# Patient Record
Sex: Female | Born: 1940 | Race: White | Hispanic: No | Marital: Married | Smoking: Never smoker
Health system: Southern US, Community
[De-identification: ages and names within clinical notes are randomized; demographics above are authoritative.]

## PROBLEM LIST (undated history)

## (undated) DIAGNOSIS — K759 Inflammatory liver disease, unspecified: Secondary | ICD-10-CM

## (undated) DIAGNOSIS — C801 Malignant (primary) neoplasm, unspecified: Secondary | ICD-10-CM

## (undated) DIAGNOSIS — G5792 Unspecified mononeuropathy of left lower limb: Secondary | ICD-10-CM

## (undated) DIAGNOSIS — K589 Irritable bowel syndrome without diarrhea: Secondary | ICD-10-CM

## (undated) DIAGNOSIS — M109 Gout, unspecified: Secondary | ICD-10-CM

## (undated) HISTORY — PX: COLONOSCOPY: SHX174

## (undated) HISTORY — PX: TONSILLECTOMY: SUR1361

## (undated) HISTORY — PX: SKIN CANCER EXCISION: SHX779

## (undated) HISTORY — PX: BREAST SURGERY: SHX581

## (undated) HISTORY — PX: ABDOMINAL HYSTERECTOMY: SHX81

---

## 2005-10-20 ENCOUNTER — Encounter: Payer: Self-pay | Admitting: Family Medicine

## 2005-11-12 ENCOUNTER — Encounter: Payer: Self-pay | Admitting: Family Medicine

## 2006-08-25 ENCOUNTER — Ambulatory Visit: Payer: Self-pay | Admitting: Dermatology

## 2007-07-27 ENCOUNTER — Ambulatory Visit: Payer: Self-pay | Admitting: Gastroenterology

## 2007-07-27 LAB — HM COLONOSCOPY

## 2012-10-25 ENCOUNTER — Ambulatory Visit: Payer: Self-pay | Admitting: Specialist

## 2012-11-24 ENCOUNTER — Ambulatory Visit: Payer: Self-pay | Admitting: Pain Medicine

## 2012-12-16 ENCOUNTER — Ambulatory Visit: Payer: Self-pay | Admitting: Pain Medicine

## 2013-01-10 ENCOUNTER — Ambulatory Visit: Payer: Self-pay | Admitting: Pain Medicine

## 2013-07-05 DIAGNOSIS — G250 Essential tremor: Secondary | ICD-10-CM | POA: Insufficient documentation

## 2013-07-05 DIAGNOSIS — K759 Inflammatory liver disease, unspecified: Secondary | ICD-10-CM | POA: Insufficient documentation

## 2013-07-05 DIAGNOSIS — G549 Nerve root and plexus disorder, unspecified: Secondary | ICD-10-CM | POA: Insufficient documentation

## 2013-11-22 ENCOUNTER — Ambulatory Visit: Payer: Medicare Other | Admitting: Podiatry

## 2013-12-01 ENCOUNTER — Ambulatory Visit (INDEPENDENT_AMBULATORY_CARE_PROVIDER_SITE_OTHER): Payer: Medicare Other | Admitting: Podiatry

## 2013-12-01 ENCOUNTER — Encounter: Payer: Self-pay | Admitting: Podiatry

## 2013-12-01 ENCOUNTER — Other Ambulatory Visit: Payer: Self-pay | Admitting: *Deleted

## 2013-12-01 VITALS — BP 113/67 | HR 65 | Resp 16 | Ht 65.0 in | Wt 120.0 lb

## 2013-12-01 DIAGNOSIS — L600 Ingrowing nail: Secondary | ICD-10-CM

## 2013-12-01 DIAGNOSIS — B351 Tinea unguium: Secondary | ICD-10-CM

## 2013-12-01 DIAGNOSIS — L608 Other nail disorders: Secondary | ICD-10-CM

## 2013-12-01 DIAGNOSIS — M21619 Bunion of unspecified foot: Secondary | ICD-10-CM

## 2013-12-01 DIAGNOSIS — M2010 Hallux valgus (acquired), unspecified foot: Secondary | ICD-10-CM

## 2013-12-01 NOTE — Patient Instructions (Addendum)

## 2013-12-01 NOTE — Progress Notes (Signed)
   Subjective:    Patient ID: CEAIRA ERNSTER, female    DOB: [birth year], 73 y.o.   MRN: [id number]  HPI Comments: 73 year old female presents the office today with complaints of thick toenails bilateral big toes. She states of the left hallux borders had been previously removed due to ingrowing toenails. Recently she has noticed a dark spot appear the left hallux nail for which she is concerned about. She recently had a pedicure her pedicurist told her to be seen by a podiatrist. She has no specific tenderness or pain related to the toes. She previously states that she was on Lamisil for a period of time for onychomycosis although it did not help.  She states that she has nerve damage the left foot for which she has been evaluated by neurology other specialties, the etiology is unknown.      Review of Systems  All other systems reviewed and are negative.      Objective:   Physical Exam  Nursing note and vitals reviewed. Constitutional: She is oriented to person, place, and time. She appears well-developed.  Musculoskeletal: Normal range of motion. She exhibits no edema and no tenderness.  Bilateral HAV  Neurological: She is alert and oriented to person, place, and time.  Decreased sensation to the left foot. Protective sensation intact to the right foot.   Skin:  Bilateral hallux nails hypertrophic, elongated, brittle, yellow-brown discoloration. Lesser digit nails with yellow discoloration. Left hallux nail has an area of black discoloration to the proximal after the nail plate. Also the left hallux nail there is lifting of the nail plate distally and along the lateral border of the nail. There is no extension of hyperpigmentation into the skin surrounding the nail. The skin around the nail is intact. There is no drainage, erythema, purulence or other clinical signs of infection around the nails.  DP/PT pulses palpable b/l. CRT <3 sec.         Assessment & Plan:  73 year old female  with onychomycosis/nail dystrophy.  -Both conservative and surgical intervention was discussed with the patient in detail including alternatives, risks, complications. -Due to the left hallux nail lifting of the plate from the nailbed as well as the hyperpigmented spot in the nail plate removal of the nail was discussed with the patient and the nail to be sent for biopsy. Patient wishes to have the nail removed at this time. Under sterile skin preparation a total of 1.5cc of 0.5% Marcaine plain was infiltrated in a hallux block fashion on the left hallux. Skin was then prepped in normal sterile fashion. The left hallux nail was then removed in total. The hyperpigmented spot appear to be localized strictly to the nail and the underlying skin was intact with no pigmentation/ulceration. Dressing was applied. The care was used and after the application the dressing was removed and there is noted the immediate CRT time to the digit. Patient tolerated the procedure well any complications. -Post procedure nail instructions discussed the patient in detail. -Right hallux nail and lesser digit nails sent for biopsy for evaluation of onychomycosis. -Followup in 1 week or sooner if any problems are to arise. Monitor for signs or symptoms of infection and directed call immediately if any are to occur or go directly to the emergency room.

## 2013-12-02 ENCOUNTER — Telehealth: Payer: Self-pay | Admitting: Podiatry

## 2013-12-02 NOTE — Telephone Encounter (Signed)
Call patient to follow with her after her procedure yesterday however she did not answer but I did leave a message. Directed call any questions or concerns.

## 2013-12-08 ENCOUNTER — Ambulatory Visit (INDEPENDENT_AMBULATORY_CARE_PROVIDER_SITE_OTHER): Payer: Medicare Other | Admitting: Podiatry

## 2013-12-08 DIAGNOSIS — L608 Other nail disorders: Secondary | ICD-10-CM

## 2013-12-08 DIAGNOSIS — B351 Tinea unguium: Secondary | ICD-10-CM

## 2013-12-08 NOTE — Progress Notes (Signed)
Patient ID: ELARA COCKE, female   DOB: 03-15-1941, 73 y.o.   MRN: [id number]  She is continuing with epsom salt soaks and covering with antibiotic ointment and band-aid. She denies any systemic complaints such as fevers, chills, nausea, vomiting. Denies any pain, drainage, or redness around the area. No acute changes since last appointment.  Objective: DP/PT pulse palpable b/l. CRT < 3 sec. Protective sensation intact.  Left hallux nail removal site healing well without complications.Still a small granular area in the central aspect of the nail bed. No surrounding erythema, drainage, pain. No clinical sings of infection.  Assessment: 73 year old female 1 week s/p L hallux nail avulsion  Plan: Patient is doing well any clinical signs of infection. She can continue with Epson salt soaks until the wound has completely healed. Continue with antibiotic ointment and a Band-Aid daily during the day however should keep uncovered at night. Continue to monitor for any signs or symptoms of infection and directed to call the office immediately if any are to occur. We'll call her with the results of the nail biopsies once they were obtained. No other complaints.

## 2013-12-08 NOTE — Patient Instructions (Signed)
Can switch to epsom salt soaks. Antibiotic ointment and band-aid during the day. Can keep uncovered at night.

## 2013-12-13 ENCOUNTER — Telehealth: Payer: Self-pay | Admitting: *Deleted

## 2013-12-13 NOTE — Telephone Encounter (Signed)
Patient called wanted her results from the fungal culture, explained to patient that it was not back yet it could take up to a couple of weeks to get it back , patient will be notified when results are in

## 2013-12-22 ENCOUNTER — Ambulatory Visit (INDEPENDENT_AMBULATORY_CARE_PROVIDER_SITE_OTHER): Payer: Medicare Other | Admitting: Podiatry

## 2013-12-22 VITALS — BP 111/63 | HR 65 | Resp 16

## 2013-12-22 DIAGNOSIS — B351 Tinea unguium: Secondary | ICD-10-CM

## 2013-12-22 MED ORDER — EFINACONAZOLE 10 % EX SOLN: 1.0000 [drp] | Freq: Every day | CUTANEOUS | Status: DC

## 2013-12-22 NOTE — Patient Instructions (Signed)

## 2013-12-23 NOTE — Progress Notes (Signed)
Patient ID: NATOSHA BOU, female   DOB: 05/21/1940, 73 y.o.   MRN: [id number]  Subjective: Ms. Muchow returns the office they for followup evaluation status post left hallux nail avulsion due to followup with biopsy results. States that she is doing well has continued with Epsom salt soaks and Intermedic ointment and a Band-Aid. Currently she denies any drainage from the area and has no complaints. Denies any systemic complaints such as fevers, chills, nausea, vomiting.  Objective: AAO x3, NAD DP/PT pulses palpable 2/4 b/l. CRT < 3sec Protective sensation intact. Left hallux nail status post removal healing well. No further granulation tissue noted in the areas healed. There is no sign erythema, drainage. No tenderness to palpation. Right hallux nail starting to lift from the underlying nailbed distally. No surrounding erythema or drainage or pain. No leg pain, swelling, warmth.  Assessment: 73 year old female with healing left hallux status post nail avulsion, onychomycosis  Plan: -At this appointment we discussed alternatives, risks, complications of various treatments for onychomycosis. Biopsy results were reviewed with the patient.  -At this time patient does not want any systemic therapy due to risk of side effects therefore she would like to proceed with Jublia knowing that it may not be the best treatment given her culture results. Patient was directed on side effects of the medication and directed to stop immediately if any are to occur in call the office. She was directed on how to use the medication. -At this time the left hallux nail site is healed. Would continue to cover as needed.  -Followup as needed. Call with any questions or concerns or change in symptoms.

## 2013-12-27 ENCOUNTER — Telehealth: Payer: Self-pay | Admitting: *Deleted

## 2013-12-27 NOTE — Telephone Encounter (Signed)
Called and left message letting pt know she can purchase fungi-nail otc or we can prescribe penlac for her. Asked pt to call back with any questions.

## 2013-12-27 NOTE — Telephone Encounter (Signed)
Message copied by Laury Axon on Tue Dec 27, 2013  1:31 PM ------      Message from: Celesta Gentile R      Created: Tue Dec 27, 2013  1:06 PM       She can use OTC fungi-nail or we could prescribe penlac. She wants to use topica. So those are our only other choices.             Also, when you get time can you call [facility] and see if they were able to do the further evaluation of the hyperpigmented spot? Thanks.  ------

## 2013-12-27 NOTE — Telephone Encounter (Signed)
Pt called and stated she did not get efinaconazole. She is wanting something else  instead of the mail in rx. Is this ok?

## 2013-12-29 ENCOUNTER — Encounter: Payer: Self-pay | Admitting: Podiatry

## 2013-12-29 ENCOUNTER — Telehealth: Payer: Self-pay | Admitting: Podiatry

## 2013-12-29 MED ORDER — TAVABOROLE 5 % EX SOLN: 1.0000 [drp] | CUTANEOUS | Status: DC

## 2013-12-29 NOTE — Telephone Encounter (Signed)
Called patient to follow up with her after the results of the nail biopsy were obtained. I called her to give her results.   Also, she states Jublia was too expensive. I prescribed kerydin and gave her the phone number to call [facility].

## 2014-01-03 ENCOUNTER — Encounter: Payer: Self-pay | Admitting: Podiatry

## 2014-11-06 ENCOUNTER — Other Ambulatory Visit: Payer: Self-pay

## 2014-11-15 ENCOUNTER — Ambulatory Visit: Admit: 2014-11-15 | Payer: Self-pay | Admitting: Specialist

## 2014-11-15 SURGERY — OSTEOTOMY, METATARSAL BONE
Anesthesia: General | Laterality: Bilateral

## 2015-07-31 DIAGNOSIS — D485 Neoplasm of uncertain behavior of skin: Secondary | ICD-10-CM | POA: Diagnosis not present

## 2015-09-05 DIAGNOSIS — H2512 Age-related nuclear cataract, left eye: Secondary | ICD-10-CM | POA: Diagnosis not present

## 2015-10-10 DIAGNOSIS — M2011 Hallux valgus (acquired), right foot: Secondary | ICD-10-CM | POA: Diagnosis not present

## 2015-10-10 DIAGNOSIS — M898X9 Other specified disorders of bone, unspecified site: Secondary | ICD-10-CM | POA: Diagnosis not present

## 2015-10-10 DIAGNOSIS — M10071 Idiopathic gout, right ankle and foot: Secondary | ICD-10-CM | POA: Diagnosis not present

## 2015-10-10 DIAGNOSIS — M79671 Pain in right foot: Secondary | ICD-10-CM | POA: Diagnosis not present

## 2015-10-31 DIAGNOSIS — H2512 Age-related nuclear cataract, left eye: Secondary | ICD-10-CM | POA: Diagnosis not present

## 2015-11-05 ENCOUNTER — Encounter: Payer: Self-pay | Admitting: *Deleted

## 2015-11-13 ENCOUNTER — Ambulatory Visit
Admission: RE | Admit: 2015-11-13 | Discharge: 2015-11-13 | Disposition: A | Discharge status: Home / Self Care | Payer: PPO | Source: Ambulatory Visit | Attending: Ophthalmology | Admitting: Ophthalmology

## 2015-11-13 ENCOUNTER — Encounter: Payer: Self-pay | Admitting: *Deleted

## 2015-11-13 ENCOUNTER — Ambulatory Visit: Payer: PPO | Admitting: Anesthesiology

## 2015-11-13 ENCOUNTER — Encounter
Admission: RE | Disposition: A | Discharge status: Home / Self Care | Payer: Self-pay | Source: Ambulatory Visit | Attending: Ophthalmology

## 2015-11-13 DIAGNOSIS — Z8619 Personal history of other infectious and parasitic diseases: Secondary | ICD-10-CM | POA: Insufficient documentation

## 2015-11-13 DIAGNOSIS — H2512 Age-related nuclear cataract, left eye: Secondary | ICD-10-CM | POA: Insufficient documentation

## 2015-11-13 DIAGNOSIS — Z8719 Personal history of other diseases of the digestive system: Secondary | ICD-10-CM | POA: Diagnosis not present

## 2015-11-13 DIAGNOSIS — Z85828 Personal history of other malignant neoplasm of skin: Secondary | ICD-10-CM | POA: Insufficient documentation

## 2015-11-13 DIAGNOSIS — M109 Gout, unspecified: Secondary | ICD-10-CM | POA: Insufficient documentation

## 2015-11-13 HISTORY — DX: Inflammatory liver disease, unspecified: K75.9

## 2015-11-13 HISTORY — DX: Unspecified mononeuropathy of left lower limb: G57.92

## 2015-11-13 HISTORY — DX: Malignant (primary) neoplasm, unspecified: C80.1

## 2015-11-13 HISTORY — DX: Gout, unspecified: M10.9

## 2015-11-13 HISTORY — PX: CATARACT EXTRACTION W/PHACO: SHX586

## 2015-11-13 HISTORY — DX: Irritable bowel syndrome, unspecified: K58.9

## 2015-11-13 SURGERY — PHACOEMULSIFICATION, CATARACT, WITH IOL INSERTION
Anesthesia: General | Site: Eye | Laterality: Left | Wound class: Clean

## 2015-11-13 MED ORDER — LIDOCAINE HCL (PF) 4 % IJ SOLN
INTRAMUSCULAR | Status: AC
  Filled 2015-11-13: qty 5

## 2015-11-13 MED ORDER — CEFUROXIME OPHTHALMIC INJECTION 1 MG/0.1 ML
INJECTION | OPHTHALMIC | Status: AC
  Filled 2015-11-13: qty 0.1

## 2015-11-13 MED ORDER — POVIDONE-IODINE 5 % OP SOLN
1.0000 "application " | Freq: Once | OPHTHALMIC | Status: AC
  Administered 2015-11-13: 1 via OPHTHALMIC

## 2015-11-13 MED ORDER — NA CHONDROIT SULF-NA HYALURON 40-17 MG/ML IO SOLN
INTRAOCULAR | Status: AC
  Filled 2015-11-13: qty 1

## 2015-11-13 MED ORDER — ARMC OPHTHALMIC DILATING GEL
OPHTHALMIC | Status: AC
  Administered 2015-11-13: 1 via OPHTHALMIC
  Filled 2015-11-13: qty 0.25

## 2015-11-13 MED ORDER — ALFENTANIL 500 MCG/ML IJ INJ
INJECTION | INTRAMUSCULAR | Status: DC | PRN
  Administered 2015-11-13: 500 ug via INTRAVENOUS

## 2015-11-13 MED ORDER — ONDANSETRON HCL 4 MG/2ML IJ SOLN: 4.0000 mg | Freq: Once | INTRAMUSCULAR | Status: DC | PRN

## 2015-11-13 MED ORDER — NA CHONDROIT SULF-NA HYALURON 40-17 MG/ML IO SOLN
INTRAOCULAR | Status: DC | PRN
  Administered 2015-11-13: 1 mL via INTRAOCULAR

## 2015-11-13 MED ORDER — EPINEPHRINE HCL 1 MG/ML IJ SOLN
INTRAMUSCULAR | Status: DC | PRN
  Administered 2015-11-13: 1 mL via OPHTHALMIC

## 2015-11-13 MED ORDER — TETRACAINE HCL 0.5 % OP SOLN
1.0000 [drp] | Freq: Once | OPHTHALMIC | Status: AC
  Administered 2015-11-13: 1 [drp] via OPHTHALMIC

## 2015-11-13 MED ORDER — POVIDONE-IODINE 5 % OP SOLN
OPHTHALMIC | Status: AC
  Administered 2015-11-13: 1 via OPHTHALMIC
  Filled 2015-11-13: qty 30

## 2015-11-13 MED ORDER — CARBACHOL 0.01 % IO SOLN
INTRAOCULAR | Status: DC | PRN
  Administered 2015-11-13: .5 mL via INTRAOCULAR

## 2015-11-13 MED ORDER — ACETAMINOPHEN 500 MG PO TABS
1000.0000 mg | ORAL_TABLET | Freq: Once | ORAL | Status: AC
  Administered 2015-11-13: 1000 mg via ORAL

## 2015-11-13 MED ORDER — LIDOCAINE HCL (PF) 1 % IJ SOLN
INTRAMUSCULAR | Status: AC
  Filled 2015-11-13: qty 2

## 2015-11-13 MED ORDER — CEFUROXIME OPHTHALMIC INJECTION 1 MG/0.1 ML
INJECTION | OPHTHALMIC | Status: DC | PRN
  Administered 2015-11-13: .1 mL via INTRACAMERAL

## 2015-11-13 MED ORDER — LIDOCAINE HCL (PF) 4 % IJ SOLN
INTRAMUSCULAR | Status: DC | PRN
  Administered 2015-11-13: .5 mL via OPHTHALMIC

## 2015-11-13 MED ORDER — MIDAZOLAM HCL 2 MG/2ML IJ SOLN
INTRAMUSCULAR | Status: DC | PRN
  Administered 2015-11-13 (×2): 0.5 mg via INTRAVENOUS

## 2015-11-13 MED ORDER — MOXIFLOXACIN HCL 0.5 % OP SOLN
OPHTHALMIC | Status: AC
  Filled 2015-11-13: qty 3

## 2015-11-13 MED ORDER — SODIUM CHLORIDE 0.9 % IV SOLN
INTRAVENOUS | Status: DC
  Administered 2015-11-13: 12:00:00 via INTRAVENOUS

## 2015-11-13 MED ORDER — MOXIFLOXACIN HCL 0.5 % OP SOLN
OPHTHALMIC | Status: DC | PRN
  Administered 2015-11-13: 1 [drp] via OPHTHALMIC

## 2015-11-13 MED ORDER — TETRACAINE HCL 0.5 % OP SOLN
OPHTHALMIC | Status: AC
  Administered 2015-11-13: 1 [drp] via OPHTHALMIC
  Filled 2015-11-13: qty 2

## 2015-11-13 MED ORDER — ARMC OPHTHALMIC DILATING GEL
1.0000 "application " | OPHTHALMIC | Status: AC
  Administered 2015-11-13 (×2): 1 via OPHTHALMIC

## 2015-11-13 MED ORDER — FENTANYL CITRATE (PF) 100 MCG/2ML IJ SOLN: 25.0000 ug | INTRAMUSCULAR | Status: DC | PRN

## 2015-11-13 MED ORDER — EPINEPHRINE HCL 1 MG/ML IJ SOLN
INTRAMUSCULAR | Status: AC
  Filled 2015-11-13: qty 2

## 2015-11-13 MED ORDER — ACETAMINOPHEN 500 MG PO TABS
ORAL_TABLET | ORAL | Status: AC
  Filled 2015-11-13: qty 2

## 2015-11-13 SURGICAL SUPPLY — 21 items
CANNULA ANT/CHMB 27GA (MISCELLANEOUS) ×3 IMPLANT
CUP MEDICINE 2OZ PLAST GRAD ST (MISCELLANEOUS) ×3 IMPLANT
GLOVE BIO SURGEON STRL SZ8 (GLOVE) ×3 IMPLANT
GLOVE BIOGEL M 6.5 STRL (GLOVE) ×3 IMPLANT
GLOVE SURG LX 8.0 MICRO (GLOVE) ×2
GLOVE SURG LX STRL 8.0 MICRO (GLOVE) ×1 IMPLANT
GOWN STRL REUS W/ TWL LRG LVL3 (GOWN DISPOSABLE) ×2 IMPLANT
GOWN STRL REUS W/TWL LRG LVL3 (GOWN DISPOSABLE) ×4
LENS IOL TECNIS SYMFONY 14.5 (Intraocular Lens) ×3 IMPLANT
PACK CATARACT (MISCELLANEOUS) ×3 IMPLANT
PACK CATARACT BRASINGTON LX (MISCELLANEOUS) ×3 IMPLANT
PACK EYE AFTER SURG (MISCELLANEOUS) ×3 IMPLANT
SOL BSS BAG (MISCELLANEOUS) ×3
SOL PREP PVP 2OZ (MISCELLANEOUS) ×3
SOLUTION BSS BAG (MISCELLANEOUS) ×1 IMPLANT
SOLUTION PREP PVP 2OZ (MISCELLANEOUS) ×1 IMPLANT
SYR 3ML LL SCALE MARK (SYRINGE) ×3 IMPLANT
SYR 5ML LL (SYRINGE) ×3 IMPLANT
SYR TB 1ML 27GX1/2 LL (SYRINGE) ×3 IMPLANT
WATER STERILE IRR 1000ML POUR (IV SOLUTION) ×3 IMPLANT
WIPE NON LINTING 3.25X3.25 (MISCELLANEOUS) ×3 IMPLANT

## 2015-11-13 NOTE — H&P (Signed)
  All labs reviewed. Abnormal studies sent to patients PCP when indicated.  Previous H&P reviewed, patient examined, there are NO CHANGES.  Cassandra Pilar LOUIS8/1/201711:53 AM

## 2015-11-13 NOTE — Discharge Instructions (Signed)
Eye Surgery Discharge Instructions  Expect mild scratchy sensation or mild soreness. DO NOT RUB YOUR EYE!  The day of surgery:  Minimal physical activity, but bed rest is not required  No reading, computer work, or close hand work  No bending, lifting, or straining.  May watch TV  For 24 hours:  No driving, legal decisions, or alcoholic beverages  Safety precautions  Eat anything you prefer: It is better to start with liquids, then soup then solid foods.  _____ Eye patch should be worn until postoperative exam tomorrow.  ____ Solar shield eyeglasses should be worn for comfort in the sunlight/patch while sleeping  Resume all regular medications including aspirin or Coumadin if these were discontinued prior to surgery. You may shower, bathe, shave, or wash your hair. Tylenol may be taken for mild discomfort.  Call your doctor if you experience significant pain, nausea, or vomiting, fever > 101 or other signs of infection. [phone] or [phone] Specific instructions:  Follow-up Information    PORFILIO,WILLIAM LOUIS, MD Follow up on 11/14/2015.   Specialty:  Ophthalmology Why:  9:10 Contact information: [address] [place] [address] [phone]

## 2015-11-13 NOTE — Transfer of Care (Signed)
Immediate Anesthesia Transfer of Care Note  Patient: Cassandra [name]  Procedure(s) Performed: Procedure(s) with comments: CATARACT EXTRACTION PHACO AND INTRAOCULAR LENS PLACEMENT (IOC) (Left) - [place] 00:31 AP% 11.9 CDE 3.72 Fluid pack  lot # [id number]  Patient Location: PACU and Short Stay  Anesthesia Type:MAC  Level of Consciousness: awake, oriented and patient cooperative  Airway & Oxygen Therapy: Patient Spontanous Breathing and Patient connected to nasal cannula oxygen  Post-op Assessment: Report given to RN and Post -op Vital signs reviewed and stable  Post vital signs: Reviewed and stable  Last Vitals:  Vitals:   11/13/15 1107  BP: 139/75  Pulse: 86  Resp: 20  Temp: 36.7 C    Last Pain:  Vitals:   11/13/15 1107  TempSrc: Oral      Patients Stated Pain Goal: 0 ([id number])  Complications: No apparent anesthesia complications

## 2015-11-13 NOTE — Anesthesia Preprocedure Evaluation (Signed)
Anesthesia Evaluation  Patient identified by MRN, date of birth, ID band Patient awake    Reviewed: Allergy & Precautions, H&P , NPO status , Patient's Chart, lab work & pertinent test results, reviewed documented beta blocker date and time   Airway Mallampati: II  TM Distance: >3 FB Neck ROM: full    Dental  (+) Teeth Intact   Pulmonary neg pulmonary ROS,    Pulmonary exam normal        Cardiovascular negative cardio ROS Normal cardiovascular exam Rhythm:regular Rate:Normal     Neuro/Psych  Neuromuscular disease negative neurological ROS  negative psych ROS   GI/Hepatic negative GI ROS, Neg liver ROS, (+) Hepatitis -  Endo/Other  negative endocrine ROS  Renal/GU negative Renal ROS  negative genitourinary   Musculoskeletal   Abdominal   Peds  Hematology negative hematology ROS (+)   Anesthesia Other Findings Past Medical History: No date: Cancer ([place])     Comment: skin cancer No date: Gout No date: Hepatitis     Comment: 40 years ago No date: Irritable bowel syndrome     Comment: history of.... No date: Nerve damage of left foot Past Surgical History: No date: ABDOMINAL HYSTERECTOMY No date: COLONOSCOPY No date: SKIN CANCER EXCISION No date: TONSILLECTOMY BMI    Body Mass Index:  19.64 kg/m     Reproductive/Obstetrics negative OB ROS                             Anesthesia Physical Anesthesia Plan  ASA: II  Anesthesia Plan: General ETT   Post-op Pain Management:    Induction:   Airway Management Planned:   Additional Equipment:   Intra-op Plan:   Post-operative Plan:   Informed Consent: I have reviewed the patients History and Physical, chart, labs and discussed the procedure including the risks, benefits and alternatives for the proposed anesthesia with the patient or authorized representative who has indicated his/her understanding and acceptance.   Dental  Advisory Given  Plan Discussed with: CRNA  Anesthesia Plan Comments:         Anesthesia Quick Evaluation

## 2015-11-13 NOTE — Op Note (Signed)
PREOPERATIVE DIAGNOSIS:  Nuclear sclerotic cataract of the left eye.   POSTOPERATIVE DIAGNOSIS:  nuclear sclerotic cataract left eye   OPERATIVE PROCEDURE:  Procedure(s): CATARACT EXTRACTION PHACO AND INTRAOCULAR LENS PLACEMENT (IOC)   SURGEON:  Birder Robson, MD.   ANESTHESIA:   Anesthesiologist: Molli Barrows, MD CRNA: Courtney Paris, CRNA  1.      Managed anesthesia care. 2.      Topical tetracaine drops followed by 2% Xylocaine jelly applied in the preoperative holding area.   COMPLICATIONS:  None.   TECHNIQUE:   Stop and chop   DESCRIPTION OF PROCEDURE:  The patient was examined and consented in the preoperative holding area where the aforementioned topical anesthesia was applied to the left eye and then brought back to the Operating Room where the left eye was prepped and draped in the usual sterile ophthalmic fashion and a lid speculum was placed. A paracentesis was created with the side port blade and the anterior chamber was filled with viscoelastic. A near clear corneal incision was performed with the steel keratome. A continuous curvilinear capsulorrhexis was performed with a cystotome followed by the capsulorrhexis forceps. Hydrodissection and hydrodelineation were carried out with BSS on a blunt cannula. The lens was removed in a stop and chop  technique and the remaining cortical material was removed with the irrigation-aspiration handpiece. The capsular bag was inflated with viscoelastic and the Technis ZCB00 lens was placed in the capsular bag without complication. The remaining viscoelastic was removed from the eye with the irrigation-aspiration handpiece. The wounds were hydrated. The anterior chamber was flushed with Miostat and the eye was inflated to physiologic pressure. 0.1 mL of cefuroxime concentration 10 mg/mL was placed in the anterior chamber. The wounds were found to be water tight. The eye was dressed with Vigamox. The patient was given protective glasses to wear  throughout the day and a shield with which to sleep tonight. The patient was also given drops with which to begin a drop regimen today and will follow-up with me in one day.  Implant Name Type Inv. Item Serial No. Manufacturer Lot No. LRB No. Used  LENS IOL SYMFONY 14.5 - [id number] Intraocular Lens LENS IOL SYMFONY 14.5 [id number]   Left 1   Procedure(s) with comments: CATARACT EXTRACTION PHACO AND INTRAOCULAR LENS PLACEMENT (IOC) (Left) - [place] 00:31 AP% 11.9 CDE 3.72 Fluid pack  lot # [id number]  Electronically signed: [place] 11/13/2015 12:28 PM

## 2015-11-14 NOTE — Anesthesia Postprocedure Evaluation (Signed)
Anesthesia Post Note  Patient: Cassandra [name]  Procedure(s) Performed: Procedure(s) (LRB): CATARACT EXTRACTION PHACO AND INTRAOCULAR LENS PLACEMENT (IOC) (Left)  Patient location during evaluation: PACU Anesthesia Type: General Level of consciousness: awake and alert Pain management: pain level controlled Vital Signs Assessment: post-procedure vital signs reviewed and stable Respiratory status: spontaneous breathing, nonlabored ventilation, respiratory function stable and patient connected to nasal cannula oxygen Cardiovascular status: blood pressure returned to baseline and stable Postop Assessment: no signs of nausea or vomiting Anesthetic complications: no    Last Vitals:  Vitals:   11/13/15 1230 11/13/15 1252  BP: 111/77 134/60  Pulse: (!) 46 (!) 56  Resp: 16 16  Temp: 36.8 C     Last Pain:  Vitals:   11/13/15 1252  TempSrc:   PainSc: [place]

## 2015-12-04 DIAGNOSIS — H2511 Age-related nuclear cataract, right eye: Secondary | ICD-10-CM | POA: Diagnosis not present

## 2015-12-05 ENCOUNTER — Encounter: Payer: Self-pay | Admitting: *Deleted

## 2015-12-11 ENCOUNTER — Ambulatory Visit: Payer: PPO | Admitting: Certified Registered"

## 2015-12-11 ENCOUNTER — Encounter: Payer: Self-pay | Admitting: *Deleted

## 2015-12-11 ENCOUNTER — Ambulatory Visit
Admission: RE | Admit: 2015-12-11 | Discharge: 2015-12-11 | Disposition: A | Discharge status: Home / Self Care | Payer: PPO | Source: Ambulatory Visit | Attending: Ophthalmology | Admitting: Ophthalmology

## 2015-12-11 ENCOUNTER — Encounter
Admission: RE | Disposition: A | Discharge status: Home / Self Care | Payer: Self-pay | Source: Ambulatory Visit | Attending: Ophthalmology

## 2015-12-11 DIAGNOSIS — H2511 Age-related nuclear cataract, right eye: Secondary | ICD-10-CM | POA: Diagnosis not present

## 2015-12-11 HISTORY — PX: CATARACT EXTRACTION W/PHACO: SHX586

## 2015-12-11 SURGERY — PHACOEMULSIFICATION, CATARACT, WITH IOL INSERTION
Anesthesia: Monitor Anesthesia Care | Site: Eye | Laterality: Right | Wound class: Clean

## 2015-12-11 MED ORDER — EPINEPHRINE HCL 1 MG/ML IJ SOLN
INTRAMUSCULAR | Status: AC
  Filled 2015-12-11: qty 1

## 2015-12-11 MED ORDER — ARMC OPHTHALMIC DILATING GEL
1.0000 | OPHTHALMIC | Status: AC | PRN
Start: 2015-12-11 — End: 2015-12-11
  Administered 2015-12-11 (×2): 1 via OPHTHALMIC

## 2015-12-11 MED ORDER — TETRACAINE HCL 0.5 % OP SOLN
1.0000 [drp] | Freq: Once | OPHTHALMIC | Status: AC
  Administered 2015-12-11: 1 [drp] via OPHTHALMIC

## 2015-12-11 MED ORDER — CARBACHOL 0.01 % IO SOLN
INTRAOCULAR | Status: DC | PRN
  Administered 2015-12-11: 0.5 mL via INTRAOCULAR

## 2015-12-11 MED ORDER — BSS IO SOLN
INTRAOCULAR | Status: DC | PRN
  Administered 2015-12-11: 200 mL via OPHTHALMIC

## 2015-12-11 MED ORDER — SODIUM CHLORIDE 0.9 % IV SOLN
INTRAVENOUS | Status: DC
  Administered 2015-12-11: 10:00:00 via INTRAVENOUS

## 2015-12-11 MED ORDER — GLYCOPYRROLATE 0.2 MG/ML IJ SOLN
INTRAMUSCULAR | Status: DC | PRN
  Administered 2015-12-11: 0.2 mg via INTRAVENOUS

## 2015-12-11 MED ORDER — ARMC OPHTHALMIC DILATING GEL
OPHTHALMIC | Status: AC
  Administered 2015-12-11: 1 via OPHTHALMIC
  Filled 2015-12-11: qty 0.25

## 2015-12-11 MED ORDER — MOXIFLOXACIN HCL 0.5 % OP SOLN
OPHTHALMIC | Status: DC | PRN
  Administered 2015-12-11: 1 [drp] via OPHTHALMIC

## 2015-12-11 MED ORDER — ACETAMINOPHEN 325 MG PO TABS
ORAL_TABLET | ORAL | Status: AC
  Filled 2015-12-11: qty 2

## 2015-12-11 MED ORDER — NA CHONDROIT SULF-NA HYALURON 40-17 MG/ML IO SOLN
INTRAOCULAR | Status: DC | PRN
  Administered 2015-12-11: 1 mL via INTRAOCULAR

## 2015-12-11 MED ORDER — LIDOCAINE HCL (PF) 4 % IJ SOLN
INTRAOCULAR | Status: DC | PRN
  Administered 2015-12-11: 4 mL via OPHTHALMIC

## 2015-12-11 MED ORDER — CEFUROXIME OPHTHALMIC INJECTION 1 MG/0.1 ML
INJECTION | OPHTHALMIC | Status: DC | PRN
  Administered 2015-12-11: 0.1 mL via INTRACAMERAL

## 2015-12-11 MED ORDER — TETRACAINE HCL 0.5 % OP SOLN
OPHTHALMIC | Status: AC
  Administered 2015-12-11: 1 [drp] via OPHTHALMIC
  Filled 2015-12-11: qty 2

## 2015-12-11 MED ORDER — CEFUROXIME OPHTHALMIC INJECTION 1 MG/0.1 ML
INJECTION | OPHTHALMIC | Status: AC
  Filled 2015-12-11: qty 0.1

## 2015-12-11 MED ORDER — ACETAMINOPHEN 325 MG PO TABS: 650.0000 mg | ORAL_TABLET | Freq: Once | ORAL | Status: DC

## 2015-12-11 MED ORDER — POVIDONE-IODINE 5 % OP SOLN
1.0000 "application " | Freq: Once | OPHTHALMIC | Status: AC
  Administered 2015-12-11: 1 via OPHTHALMIC

## 2015-12-11 MED ORDER — MOXIFLOXACIN HCL 0.5 % OP SOLN
OPHTHALMIC | Status: AC
  Filled 2015-12-11: qty 3

## 2015-12-11 MED ORDER — MIDAZOLAM HCL 2 MG/2ML IJ SOLN
INTRAMUSCULAR | Status: DC | PRN
  Administered 2015-12-11: 1 mg via INTRAVENOUS

## 2015-12-11 MED ORDER — FENTANYL CITRATE (PF) 100 MCG/2ML IJ SOLN
INTRAMUSCULAR | Status: DC | PRN
  Administered 2015-12-11: 50 ug via INTRAVENOUS

## 2015-12-11 MED ORDER — MOXIFLOXACIN HCL 0.5 % OP SOLN: 1.0000 [drp] | OPHTHALMIC | Status: DC | PRN

## 2015-12-11 MED ORDER — NA CHONDROIT SULF-NA HYALURON 40-17 MG/ML IO SOLN
INTRAOCULAR | Status: AC
  Filled 2015-12-11: qty 1

## 2015-12-11 MED ORDER — POVIDONE-IODINE 5 % OP SOLN
OPHTHALMIC | Status: AC
  Administered 2015-12-11: 1 via OPHTHALMIC
  Filled 2015-12-11: qty 30

## 2015-12-11 SURGICAL SUPPLY — 21 items
CANNULA ANT/CHMB 27GA (MISCELLANEOUS) ×2 IMPLANT
CUP MEDICINE 2OZ PLAST GRAD ST (MISCELLANEOUS) ×2 IMPLANT
GLOVE BIO SURGEON STRL SZ8 (GLOVE) ×2 IMPLANT
GLOVE BIOGEL M 6.5 STRL (GLOVE) ×2 IMPLANT
GLOVE SURG LX 8.0 MICRO (GLOVE) ×1
GLOVE SURG LX STRL 8.0 MICRO (GLOVE) ×1 IMPLANT
GOWN STRL REUS W/ TWL LRG LVL3 (GOWN DISPOSABLE) ×2 IMPLANT
GOWN STRL REUS W/TWL LRG LVL3 (GOWN DISPOSABLE) ×2
LENS IOL TECNIS SYMFONY 14.5 (Intraocular Lens) ×2 IMPLANT
PACK CATARACT (MISCELLANEOUS) ×2 IMPLANT
PACK CATARACT BRASINGTON LX (MISCELLANEOUS) ×2 IMPLANT
PACK EYE AFTER SURG (MISCELLANEOUS) ×2 IMPLANT
SOL BSS BAG (MISCELLANEOUS) ×2
SOL PREP PVP 2OZ (MISCELLANEOUS) ×2
SOLUTION BSS BAG (MISCELLANEOUS) ×1 IMPLANT
SOLUTION PREP PVP 2OZ (MISCELLANEOUS) ×1 IMPLANT
SYR 3ML LL SCALE MARK (SYRINGE) ×2 IMPLANT
SYR 5ML LL (SYRINGE) ×2 IMPLANT
SYR TB 1ML 27GX1/2 LL (SYRINGE) ×2 IMPLANT
WATER STERILE IRR 250ML POUR (IV SOLUTION) ×2 IMPLANT
WIPE NON LINTING 3.25X3.25 (MISCELLANEOUS) ×2 IMPLANT

## 2015-12-11 NOTE — H&P (Signed)
  All labs reviewed. Abnormal studies sent to patients PCP when indicated.  Previous H&P reviewed, patient examined, there are NO CHANGES.  Merrilyn Legler LOUIS8/29/201711:27 AM

## 2015-12-11 NOTE — Anesthesia Preprocedure Evaluation (Signed)
Anesthesia Evaluation  Patient identified by MRN, date of birth, ID band Patient awake    Reviewed: Allergy & Precautions, NPO status , Patient's Chart, lab work & pertinent test results  History of Anesthesia Complications Negative for: history of anesthetic complications  Airway Mallampati: II       Dental   Pulmonary neg pulmonary ROS,           Cardiovascular negative cardio ROS       Neuro/Psych negative neurological ROS     GI/Hepatic negative GI ROS, (+) Hepatitis -  Endo/Other  negative endocrine ROS  Renal/GU negative Renal ROS     Musculoskeletal   Abdominal   Peds  Hematology negative hematology ROS (+)   Anesthesia Other Findings   Reproductive/Obstetrics                             Anesthesia Physical Anesthesia Plan  ASA: II  Anesthesia Plan: MAC   Post-op Pain Management:    Induction: Intravenous  Airway Management Planned:   Additional Equipment:   Intra-op Plan:   Post-operative Plan:   Informed Consent: I have reviewed the patients History and Physical, chart, labs and discussed the procedure including the risks, benefits and alternatives for the proposed anesthesia with the patient or authorized representative who has indicated his/her understanding and acceptance.     Plan Discussed with:   Anesthesia Plan Comments:         Anesthesia Quick Evaluation

## 2015-12-11 NOTE — Transfer of Care (Signed)
Immediate Anesthesia Transfer of Care Note  Patient: Cassandra [name]  Procedure(s) Performed: Procedure(s) with comments: CATARACT EXTRACTION PHACO AND INTRAOCULAR LENS PLACEMENT (IOC) (Right) - Lot# [id number] H [place]: 00:42.4 AP%: 18.9 CDE:8.01   Patient Location: PACU and Short Stay  Anesthesia Type:MAC  Level of Consciousness: awake, alert  and oriented  Airway & Oxygen Therapy: Patient Spontanous Breathing  Post-op Assessment: Report given to RN and Post -op Vital signs reviewed and stable  Post vital signs: Reviewed and stable  Last Vitals:  Vitals:   12/11/15 0955 12/11/15 1158  BP: 103/81 (!) 111/43  Pulse: (!) 54   Resp: 18 16  Temp: 36.6 C 36.6 C    Last Pain:  Vitals:   12/11/15 1158  TempSrc: Oral         Complications: No apparent anesthesia complications

## 2015-12-11 NOTE — Anesthesia Postprocedure Evaluation (Signed)
Anesthesia Post Note  Patient: Cassandra [name]  Procedure(s) Performed: Procedure(s) (LRB): CATARACT EXTRACTION PHACO AND INTRAOCULAR LENS PLACEMENT (IOC) (Right)  Patient location during evaluation: Short Stay Anesthesia Type: MAC Level of consciousness: awake and alert and oriented Pain management: pain level controlled Vital Signs Assessment: post-procedure vital signs reviewed and stable Respiratory status: spontaneous breathing and nonlabored ventilation Cardiovascular status: stable Postop Assessment: no signs of nausea or vomiting Anesthetic complications: no    Last Vitals:  Vitals:   12/11/15 1155 12/11/15 1158  BP: (!) 111/43 (!) 111/43  Pulse: 66   Resp: 18 16  Temp: 36.7 C 36.6 C    Last Pain:  Vitals:   12/11/15 1158  TempSrc: Oral                 Lanora Manis

## 2015-12-11 NOTE — Discharge Instructions (Signed)
Eye Surgery Discharge Instructions  Expect mild scratchy sensation or mild soreness. DO NOT RUB YOUR EYE!  The day of surgery:  Minimal physical activity, but bed rest is not required  No reading, computer work, or close hand work  No bending, lifting, or straining.  May watch TV  For 24 hours:  No driving, legal decisions, or alcoholic beverages  Safety precautions  Eat anything you prefer: It is better to start with liquids, then soup then solid foods.  _____ Eye patch should be worn until postoperative exam tomorrow.  ____ Solar shield eyeglasses should be worn for comfort in the sunlight/patch while sleeping  Resume all regular medications including aspirin or Coumadin if these were discontinued prior to surgery. You may shower, bathe, shave, or wash your hair. Tylenol may be taken for mild discomfort.  Call your doctor if you experience significant pain, nausea, or vomiting, fever > 101 or other signs of infection. [phone] or [phone] Specific instructions:  Follow-up Information    Tim Lair, MD .   Specialty:  Ophthalmology Why:  August 30 at 9:35am Contact information: [place]  [address] [phone]

## 2015-12-12 NOTE — Op Note (Signed)
PREOPERATIVE DIAGNOSIS:  Nuclear sclerotic cataract of the right eye.   POSTOPERATIVE DIAGNOSIS: right nuclear sclerotic CATARACT   OPERATIVE PROCEDURE:  Procedure(s): CATARACT EXTRACTION PHACO AND INTRAOCULAR LENS PLACEMENT (IOC)   SURGEON:  Birder Robson, MD.   ANESTHESIA:  Anesthesiologist: Gunnar Fusi, MD CRNA: Rolla Plate, CRNA; Jonna Clark, CRNA  1.      Managed anesthesia care. 2.      Topical tetracaine drops followed by 2% Xylocaine jelly applied in the preoperative holding area.   COMPLICATIONS:  None.   TECHNIQUE:   Stop and chop   DESCRIPTION OF PROCEDURE:  The patient was examined and consented in the preoperative holding area where the aforementioned topical anesthesia was applied to the right eye and then brought back to the Operating Room where the right eye was prepped and draped in the usual sterile ophthalmic fashion and a lid speculum was placed. A paracentesis was created with the side port blade and the anterior chamber was filled with viscoelastic. A near clear corneal incision was performed with the steel keratome. A continuous curvilinear capsulorrhexis was performed with a cystotome followed by the capsulorrhexis forceps. Hydrodissection and hydrodelineation were carried out with BSS on a blunt cannula. The lens was removed in a stop and chop  technique and the remaining cortical material was removed with the irrigation-aspiration handpiece. The capsular bag was inflated with viscoelastic and the Technis ZCB00  lens was placed in the capsular bag without complication. The remaining viscoelastic was removed from the eye with the irrigation-aspiration handpiece. The wounds were hydrated. The anterior chamber was flushed with Miostat and the eye was inflated to physiologic pressure. 0.1 mL of cefuroxime concentration 10 mg/mL was placed in the anterior chamber. The wounds were found to be water tight. The eye was dressed with Vigamox. The patient was given  protective glasses to wear throughout the day and a shield with which to sleep tonight. The patient was also given drops with which to begin a drop regimen today and will follow-up with me in one day.  Implant Name Type Inv. Item Serial No. Manufacturer Lot No. LRB No. Used  LENS IOL SYMFONY 14.5 - [id number] Intraocular Lens LENS IOL SYMFONY 14.5 [id number]   Right 1   Procedure(s) with comments: CATARACT EXTRACTION PHACO AND INTRAOCULAR LENS PLACEMENT (IOC) (Right) - Lot# [id number] H [place]: 00:42.4 AP%: 18.9 CDE:8.01   Electronically signed: [place] 12/12/2015 11:36 AM

## 2016-03-26 DIAGNOSIS — Z85828 Personal history of other malignant neoplasm of skin: Secondary | ICD-10-CM | POA: Diagnosis not present

## 2016-03-26 DIAGNOSIS — L821 Other seborrheic keratosis: Secondary | ICD-10-CM | POA: Diagnosis not present

## 2016-03-26 DIAGNOSIS — D485 Neoplasm of uncertain behavior of skin: Secondary | ICD-10-CM | POA: Diagnosis not present

## 2016-03-26 DIAGNOSIS — L570 Actinic keratosis: Secondary | ICD-10-CM | POA: Diagnosis not present

## 2016-03-26 DIAGNOSIS — L814 Other melanin hyperpigmentation: Secondary | ICD-10-CM | POA: Diagnosis not present

## 2016-03-26 DIAGNOSIS — L820 Inflamed seborrheic keratosis: Secondary | ICD-10-CM | POA: Diagnosis not present

## 2016-04-22 DIAGNOSIS — L570 Actinic keratosis: Secondary | ICD-10-CM | POA: Diagnosis not present

## 2016-08-06 DIAGNOSIS — H43813 Vitreous degeneration, bilateral: Secondary | ICD-10-CM | POA: Diagnosis not present

## 2016-10-27 DIAGNOSIS — L570 Actinic keratosis: Secondary | ICD-10-CM | POA: Diagnosis not present

## 2016-11-18 DIAGNOSIS — Z961 Presence of intraocular lens: Secondary | ICD-10-CM | POA: Diagnosis not present

## 2016-12-12 DIAGNOSIS — Z23 Encounter for immunization: Secondary | ICD-10-CM | POA: Diagnosis not present

## 2017-02-20 ENCOUNTER — Encounter: Payer: Self-pay | Admitting: Unknown Physician Specialty

## 2017-02-20 ENCOUNTER — Ambulatory Visit: Payer: PPO | Admitting: Unknown Physician Specialty

## 2017-02-20 VITALS — BP 120/74 | HR 53 | Temp 98.4°F | Ht 65.0 in | Wt 119.5 lb

## 2017-02-20 DIAGNOSIS — G4762 Sleep related leg cramps: Secondary | ICD-10-CM | POA: Diagnosis not present

## 2017-02-20 DIAGNOSIS — Z7189 Other specified counseling: Secondary | ICD-10-CM

## 2017-02-20 DIAGNOSIS — G250 Essential tremor: Secondary | ICD-10-CM

## 2017-02-20 DIAGNOSIS — G629 Polyneuropathy, unspecified: Secondary | ICD-10-CM

## 2017-02-20 DIAGNOSIS — G2581 Restless legs syndrome: Secondary | ICD-10-CM | POA: Insufficient documentation

## 2017-02-20 MED ORDER — GABAPENTIN 100 MG PO CAPS: 300.0000 mg | ORAL_CAPSULE | Freq: Every day | ORAL | 3 refills | Status: DC

## 2017-02-20 NOTE — Patient Instructions (Signed)
Take Magnesium Citrate at night.

## 2017-02-20 NOTE — Progress Notes (Signed)
BP 120/74   Pulse (!) 53   Temp 98.4 F (36.9 C) (Oral)   Ht 5\' 5"  (1.651 m)   Wt 119 lb 8 oz (54.2 kg)   SpO2 99%   BMI 19.89 kg/m    Subjective:    Patient ID: Cassandra [name], female    DOB: 04/28/1940, 76 y.o.   MRN: [id number]  HPI: Cassandra [name] is a 76 y.o. female  Chief Complaint  Patient presents with  . Leg Cramps    pt states she has been having bilateral leg cramps for a while, states she has tried taking potassium, electrolytes, and a OTC leg cramp medication    Pt is here to re-establish as a new patient  Pt is here and would like to talk about leg cramps.  Pt states she is taking a K/Mg pill and electrolytes in her water.  States she usually has them at night.  States this is every night and it is often her entire leg.  Denies restless legs. Still has a resting tremor particularly when stressed.  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 Marital status: Married    Spouse name: Not on file  . Number of children: Not on file  . Years of education: Not on file  . Highest education level: Not on file  Social Needs  . Financial resource strain: Not on file  . Food insecurity - worry: Not on file  . Food insecurity - inability: Not on file  . Transportation needs - medical: Not on file  . Transportation needs - non-medical: Not on file  Occupational History  . Not on file  Tobacco Use  . Smoking status: Never Smoker  . Smokeless tobacco: Never Used  Substance and Sexual Activity  . Alcohol use: No  . Drug use: No  . Sexual activity: Not on file  Other Topics Concern  . Not on file  Social History Narrative  . Not on file   Family History  Problem Relation Age of Onset  . Kidney Stones Mother   . Cancer Father    Past Medical History:  Diagnosis Date  . Cancer ([place])    skin cancer  . Gout   . Hepatitis    40 years ago  . Irritable bowel syndrome    history of....  . Nerve damage of left foot     Past Surgical History:  Procedure Laterality Date  . ABDOMINAL HYSTERECTOMY    . BREAST SURGERY     BIOPSY  . COLONOSCOPY    . SKIN CANCER EXCISION    . TONSILLECTOMY       Relevant past medical, surgical, family and social history reviewed and updated as indicated. Interim medical history since our last visit reviewed. Allergies and medications reviewed and updated.  Review of Systems  Per HPI unless specifically indicated above     Objective:    BP 120/74   Pulse (!) 53   Temp 98.4 F (36.9 C) (Oral)   Ht 5\' 5"  (1.651 m)   Wt 119 lb 8 oz (54.2 kg)   SpO2 99%   BMI 19.89 kg/m   Wt Readings from Last 3 Encounters:  02/20/17 119 lb 8 oz (54.2 kg)  12/11/15 118 lb (53.5 kg)  11/13/15 118 lb (53.5 kg)    Physical Exam  Constitutional: She is oriented to person, place, and time. She appears well-developed and well-nourished. No distress.  HENT:  Head: Normocephalic and atraumatic.  Eyes: Conjunctivae and lids are normal. Right eye exhibits no discharge. Left eye exhibits no discharge. No scleral icterus.  Neck: Normal range of motion. Neck supple. No JVD present. Carotid bruit is not present.  Cardiovascular: Normal rate, regular rhythm and normal heart sounds.  Pulmonary/Chest: Effort normal and breath sounds normal.  Abdominal: Normal appearance. There is no splenomegaly or hepatomegaly.  Musculoskeletal: Normal range of motion.  Neurological: She is alert and oriented to person, place, and time.  Skin: Skin is warm, dry and intact. No rash noted. No pallor.  Psychiatric: She has a normal mood and affect. Her behavior is normal. Judgment and thought content normal.    No results found for this or any previous visit.    Assessment & Plan:   Problem List Items Addressed This Visit      Unprioritized   Advanced care planning/counseling discussion    A voluntary discussion about advance care planning including the explanation and discussion of advance  directives was extensively discussed  with the patient.  Explanation about the health care proxy and Living will was reviewed and packet with forms with explanation of how to fill them out was given.  During this discussion, the patient was able to identify a health care proxy as her husband and has filled out the paperwork required.  Patient was offered a separate [place] visit for further assistance with forms.  She plans to talk to her family about a DNR status       Benign essential tremor - Primary    This is a long standing problem.  Will check labs      Relevant Orders   Comprehensive metabolic panel   Vitamin W26   Nocturnal leg cramps    New problem.  Magnesium supplement QHS.        Relevant Orders   Comprehensive metabolic panel   Vitamin V78    Other Visit Diagnoses    Peripheral polyneuropathy       Relevant Medications   gabapentin (NEURONTIN) 100 MG capsule   Other Relevant Orders   Vitamin B12   VITAMIN D 25 Hydroxy (Vit-D Deficiency, Fractures)       Follow up plan: Return in about 4 weeks (around 03/20/2017) for pysical if posible.

## 2017-02-20 NOTE — Assessment & Plan Note (Signed)
This is a long standing problem.  Will check labs

## 2017-02-20 NOTE — Assessment & Plan Note (Addendum)
A voluntary discussion about advance care planning including the explanation and discussion of advance directives was extensively discussed  with the patient.  Explanation about the health care proxy and Living will was reviewed and packet with forms with explanation of how to fill them out was given.  During this discussion, the patient was able to identify a health care proxy as her husband and has filled out the paperwork required.  Patient was offered a separate [place] visit for further assistance with forms.  She plans to talk to her family about a DNR status

## 2017-02-20 NOTE — Assessment & Plan Note (Signed)
New problem.  Magnesium supplement QHS.

## 2017-02-21 LAB — COMPREHENSIVE METABOLIC PANEL
A/G RATIO: 2.8 — AB (ref 1.2–2.2)
ALK PHOS: 68 IU/L (ref 39–117)
ALT: 15 IU/L (ref 0–32)
AST: 23 IU/L (ref 0–40)
Albumin: 4.8 g/dL (ref 3.5–4.8)
BUN/Creatinine Ratio: 20 (ref 12–28)
BUN: 19 mg/dL (ref 8–27)
Bilirubin Total: 0.3 mg/dL (ref 0.0–1.2)
CO2: 25 mmol/L (ref 20–29)
CREATININE: 0.94 mg/dL (ref 0.57–1.00)
Calcium: 9.9 mg/dL (ref 8.7–10.3)
Chloride: 102 mmol/L (ref 96–106)
GFR calc Af Amer: 69 mL/min/{1.73_m2} (ref >59)
GFR calc non Af Amer: 60 mL/min/{1.73_m2} (ref >59)
GLOBULIN, TOTAL: 1.7 g/dL (ref 1.5–4.5)
Glucose: 92 mg/dL (ref 65–99)
POTASSIUM: 5.2 mmol/L (ref 3.5–5.2)
SODIUM: 143 mmol/L (ref 134–144)
Total Protein: 6.5 g/dL (ref 6.0–8.5)

## 2017-02-21 LAB — VITAMIN B12: VITAMIN B 12: 545 pg/mL (ref 232–1245)

## 2017-02-21 LAB — VITAMIN D 25 HYDROXY (VIT D DEFICIENCY, FRACTURES): VIT D 25 HYDROXY: 49.3 ng/mL (ref 30.0–100.0)

## 2017-02-23 ENCOUNTER — Encounter: Payer: Self-pay | Admitting: Unknown Physician Specialty

## 2017-02-23 NOTE — Progress Notes (Signed)
Normal labs.  Patient notified by letter.

## 2017-02-26 ENCOUNTER — Telehealth: Payer: Self-pay | Admitting: Family Medicine

## 2017-02-26 NOTE — Telephone Encounter (Signed)
Patient notified of normal labs and that she may get letter in mail about them.

## 2017-02-26 NOTE — Telephone Encounter (Signed)
Left message on machine for pt to return call to the office. Results routed to [facility] nurse for them to release when pt calls back. CRM updated as well.

## 2017-02-26 NOTE — Telephone Encounter (Signed)
Copied from [place] [id number]. Topic: Quick Communication - See Telephone Encounter >> Feb 26, 2017 10:34 AM Ivar Drape wrote: CRM for notification. See Telephone encounter for:  02/26/17. Patient would like the results of the labs she had on 03/02/17

## 2017-02-27 NOTE — Telephone Encounter (Signed)
Results were relayed by Nurse. Please see next telephone encounter. Will close this one.

## 2017-03-24 ENCOUNTER — Encounter: Payer: PPO | Admitting: Unknown Physician Specialty

## 2017-04-10 ENCOUNTER — Ambulatory Visit: Payer: Self-pay | Admitting: *Deleted

## 2017-04-10 NOTE — Telephone Encounter (Signed)
Pt reports dropping frame on great toe of right foot 2 weeks ago. Swelling has gone down but still present (moderate). Pain 2/10 during day..8/10 at night. Has not taken anything for pain. Pt can wear her usual shoes and walking is not affected. States did have small "open area" at tip of toe which "has mostly closed off." No drainage "dry." Mild discoloration at edge of nail.  Home care advised; ice and OTC pain relief, rest and elevation of foot. Advised to call back if symptoms worsen.  Reason for Disposition . Stubbed or jammed toe  Answer Assessment - Initial Assessment Questions 1. MECHANISM: "How did the injury happen?"      Dropped frame on great toe of right foot 2. ONSET: "When did the injury happen?" (Minutes or hours ago)      2 weeks ago 3. LOCATION: "What part of the toe is injured?" "Is the nail damaged?"      Some discoloration around the edges of nail. No bruising 4. APPEARANCE of TOE INJURY: "What does the injury look like?"      Moderate swelling 5. SEVERITY: "Can you use the foot normally?" "Can you walk?"      Can walk, wearing shoes 6. SIZE: For cuts, bruises, or swelling, ask: "How large is it?" (e.g., inches or centimeters;  entire toe)      Entire toe 7. PAIN: "Is there pain?" If so, ask: "How bad is the pain?"   (e.g., Scale 1-10; or mild, moderate, severe)     2/10  Days.Marland KitchenMarland KitchenMarland Kitchen8/10 nights 8. TETANUS: For any breaks in the skin, ask: "When was the last tetanus booster?"     Unsure 9. DIABETES: "Do you have a history of diabetes or poor circulation in the feet?"     No 10. OTHER SYMPTOMS: "Do you have any other symptoms?"        No  Protocols used: TOE INJURY-A-AH

## 2017-06-05 ENCOUNTER — Other Ambulatory Visit: Payer: Self-pay | Admitting: Unknown Physician Specialty

## 2017-06-10 ENCOUNTER — Ambulatory Visit (INDEPENDENT_AMBULATORY_CARE_PROVIDER_SITE_OTHER): Payer: PPO | Admitting: Unknown Physician Specialty

## 2017-06-10 ENCOUNTER — Encounter: Payer: Self-pay | Admitting: Unknown Physician Specialty

## 2017-06-10 VITALS — BP 119/77 | HR 52 | Temp 98.3°F | Ht 64.6 in | Wt 118.0 lb

## 2017-06-10 DIAGNOSIS — G4762 Sleep related leg cramps: Secondary | ICD-10-CM | POA: Diagnosis not present

## 2017-06-10 DIAGNOSIS — Z5181 Encounter for therapeutic drug level monitoring: Secondary | ICD-10-CM | POA: Diagnosis not present

## 2017-06-10 DIAGNOSIS — Z0000 Encounter for general adult medical examination without abnormal findings: Secondary | ICD-10-CM | POA: Diagnosis not present

## 2017-06-10 DIAGNOSIS — Z0001 Encounter for general adult medical examination with abnormal findings: Secondary | ICD-10-CM | POA: Diagnosis not present

## 2017-06-10 DIAGNOSIS — Z7189 Other specified counseling: Secondary | ICD-10-CM

## 2017-06-10 MED ORDER — GABAPENTIN 400 MG PO CAPS: 400.0000 mg | ORAL_CAPSULE | Freq: Every day | ORAL | 3 refills | Status: DC

## 2017-06-10 NOTE — Patient Instructions (Signed)
Magnesium Citrate or Glycinate

## 2017-06-10 NOTE — Assessment & Plan Note (Addendum)
Unstable problem.  Make sure Magnesium product is one easily absorbed.  Increase Gabapentin to 400 mg QHS.  Discussed vinegar and mustard at the time of the cramp

## 2017-06-10 NOTE — Assessment & Plan Note (Signed)
Has living will and [facility]

## 2017-06-10 NOTE — Progress Notes (Addendum)
BP 119/77   Pulse (!) 52   Temp 98.3 F (36.8 C) (Oral)   Ht 5' 4.6" (1.641 m)   Wt 118 lb (53.5 kg)   SpO2 95%   BMI 19.88 kg/m    Subjective:    Patient ID: Cassandra [name], female    DOB: [birth year], 77 y.o.   MRN: [id number]  HPI: Cassandra [name] is a 77 y.o. female  Chief Complaint  Patient presents with  . Medicare Wellness    Leg cramps Pt with bilateral leg cramps at night.  Normally takes 3 pills and sometimes needs to take more.  States she feels she is somewhat sedated when she has to take a lot.  Not sure which Magnesium product she is using.    Relevant past medical, surgical, family and social history reviewed and updated as indicated. Interim medical history since our last visit reviewed. Allergies and medications reviewed and updated.  Review of Systems  Constitutional: Negative.   HENT: Negative.   Eyes: Negative.   Respiratory: Negative.   Cardiovascular: Negative.   Gastrointestinal: Negative.   Endocrine: Negative.   Genitourinary: Negative.   Musculoskeletal: Negative.   Skin: Negative.   Allergic/Immunologic: Negative.   Neurological: Negative for dizziness, syncope and weakness.  Hematological: Negative.   Psychiatric/Behavioral: Negative.     Per HPI unless specifically indicated above     Objective:    BP 119/77   Pulse (!) 52   Temp 98.3 F (36.8 C) (Oral)   Ht 5' 4.6" (1.641 m)   Wt 118 lb (53.5 kg)   SpO2 95%   BMI 19.88 kg/m   Wt Readings from Last 3 Encounters:  06/10/17 118 lb (53.5 kg)  02/20/17 119 lb 8 oz (54.2 kg)  12/11/15 118 lb (53.5 kg)    Physical Exam  Constitutional: She is oriented to person, place, and time. She appears well-developed and well-nourished. No distress.  HENT:  Head: Normocephalic and atraumatic.  Eyes: Conjunctivae and lids are normal. Right eye exhibits no discharge. Left eye exhibits no discharge. No scleral icterus.  Neck: Normal range of motion. Neck supple. No JVD present.  Carotid bruit is not present.  Cardiovascular: Normal rate, regular rhythm and normal heart sounds.  Pulmonary/Chest: Effort normal and breath sounds normal.  Abdominal: Normal appearance. There is no splenomegaly or hepatomegaly.  Musculoskeletal: Normal range of motion.  Neurological: She is alert and oriented to person, place, and time.  Skin: Skin is warm, dry and intact. No rash noted. No pallor.  Psychiatric: She has a normal mood and affect. Her behavior is normal. Judgment and thought content normal.   Refusing breast exam.      Assessment & Plan:   Problem List Items Addressed This Visit      Unprioritized   Advanced care planning/counseling discussion    Has living will and HCPOA      Nocturnal leg cramps    Unstable problem.  Make sure Magnesium product is one easily absorbed.  Increase Gabapentin to 400 mg QHS.  Discussed vinegar and mustard at the time of the cramp      Relevant Orders   Ferritin    Other Visit Diagnoses    Annual physical exam    -  Primary   Relevant Orders   Lipid Panel w/o Chol/HDL Ratio   Medication monitoring encounter       Relevant Orders   Comprehensive metabolic panel      Health maintenance: Refusing mammogram, dexa scan,  Td, PNA, Influenza, and Colonoscopy.  Not interested in alternatives.    Follow up plan: Return in about 1 year (around 06/10/2018), or if symptoms worsen or fail to improve.

## 2017-06-10 NOTE — Addendum Note (Signed)
Addended by: Kathrine Haddock on: 06/10/2017 10:31 AM   Modules accepted: Orders

## 2017-06-11 ENCOUNTER — Encounter: Payer: Self-pay | Admitting: Unknown Physician Specialty

## 2017-06-11 LAB — COMPREHENSIVE METABOLIC PANEL
ALBUMIN: 4.5 g/dL (ref 3.5–4.8)
ALK PHOS: 79 IU/L (ref 39–117)
ALT: 17 IU/L (ref 0–32)
AST: 22 IU/L (ref 0–40)
Albumin/Globulin Ratio: 2.5 — ABNORMAL HIGH (ref 1.2–2.2)
BUN / CREAT RATIO: 29 — AB (ref 12–28)
BUN: 22 mg/dL (ref 8–27)
Bilirubin Total: 0.3 mg/dL (ref 0.0–1.2)
CALCIUM: 9.5 mg/dL (ref 8.7–10.3)
CO2: 22 mmol/L (ref 20–29)
CREATININE: 0.75 mg/dL (ref 0.57–1.00)
Chloride: 107 mmol/L — ABNORMAL HIGH (ref 96–106)
GFR calc non Af Amer: 79 mL/min/{1.73_m2} (ref >59)
GFR, EST AFRICAN AMERICAN: 91 mL/min/{1.73_m2} (ref >59)
GLUCOSE: 91 mg/dL (ref 65–99)
Globulin, Total: 1.8 g/dL (ref 1.5–4.5)
Potassium: 4.6 mmol/L (ref 3.5–5.2)
Sodium: 142 mmol/L (ref 134–144)
TOTAL PROTEIN: 6.3 g/dL (ref 6.0–8.5)

## 2017-06-11 LAB — LIPID PANEL W/O CHOL/HDL RATIO
Cholesterol, Total: 184 mg/dL (ref 100–199)
HDL: 80 mg/dL (ref >39)
LDL Calculated: 94 mg/dL (ref 0–99)
Triglycerides: 48 mg/dL (ref 0–149)
VLDL Cholesterol Cal: 10 mg/dL (ref 5–40)

## 2017-06-11 LAB — FERRITIN: FERRITIN: 46 ng/mL (ref 15–150)

## 2017-08-27 ENCOUNTER — Telehealth: Payer: Self-pay

## 2017-08-27 NOTE — Telephone Encounter (Signed)
Copied from [place]. Topic: Medicare AWV >> Aug 27, 2017  1:59 PM [place], [place] A, LPN wrote: Reason for CRM: Called to schedule medicare annual wellness visit with NHA- Jamarrion Budai,LPN at [facility]. Please schedule anytime.    Spoke with patient she will call back this afternoon.   Can transfer call to [facility] directly if patient requests or has any questions.

## 2017-08-31 DIAGNOSIS — H903 Sensorineural hearing loss, bilateral: Secondary | ICD-10-CM | POA: Diagnosis not present

## 2017-09-02 DIAGNOSIS — L820 Inflamed seborrheic keratosis: Secondary | ICD-10-CM | POA: Diagnosis not present

## 2017-09-02 DIAGNOSIS — L814 Other melanin hyperpigmentation: Secondary | ICD-10-CM | POA: Diagnosis not present

## 2017-09-02 DIAGNOSIS — L570 Actinic keratosis: Secondary | ICD-10-CM | POA: Diagnosis not present

## 2017-09-02 DIAGNOSIS — Z85828 Personal history of other malignant neoplasm of skin: Secondary | ICD-10-CM | POA: Diagnosis not present

## 2017-09-15 DIAGNOSIS — H26491 Other secondary cataract, right eye: Secondary | ICD-10-CM | POA: Diagnosis not present

## 2017-09-15 DIAGNOSIS — H35372 Puckering of macula, left eye: Secondary | ICD-10-CM | POA: Diagnosis not present

## 2017-09-22 DIAGNOSIS — M21619 Bunion of unspecified foot: Secondary | ICD-10-CM | POA: Insufficient documentation

## 2017-09-22 DIAGNOSIS — M13872 Other specified arthritis, left ankle and foot: Secondary | ICD-10-CM | POA: Diagnosis not present

## 2017-12-15 DIAGNOSIS — Z85828 Personal history of other malignant neoplasm of skin: Secondary | ICD-10-CM | POA: Diagnosis not present

## 2017-12-15 DIAGNOSIS — D0439 Carcinoma in situ of skin of other parts of face: Secondary | ICD-10-CM | POA: Diagnosis not present

## 2017-12-15 DIAGNOSIS — C44719 Basal cell carcinoma of skin of left lower limb, including hip: Secondary | ICD-10-CM | POA: Diagnosis not present

## 2017-12-15 DIAGNOSIS — L820 Inflamed seborrheic keratosis: Secondary | ICD-10-CM | POA: Diagnosis not present

## 2018-01-19 DIAGNOSIS — D0430 Carcinoma in situ of skin of unspecified part of face: Secondary | ICD-10-CM | POA: Diagnosis not present

## 2018-01-19 DIAGNOSIS — C44719 Basal cell carcinoma of skin of left lower limb, including hip: Secondary | ICD-10-CM | POA: Diagnosis not present

## 2018-02-03 DIAGNOSIS — Z09 Encounter for follow-up examination after completed treatment for conditions other than malignant neoplasm: Secondary | ICD-10-CM | POA: Diagnosis not present

## 2018-02-03 DIAGNOSIS — D485 Neoplasm of uncertain behavior of skin: Secondary | ICD-10-CM | POA: Diagnosis not present

## 2018-02-03 DIAGNOSIS — H35372 Puckering of macula, left eye: Secondary | ICD-10-CM | POA: Diagnosis not present

## 2018-02-03 DIAGNOSIS — C44519 Basal cell carcinoma of skin of other part of trunk: Secondary | ICD-10-CM | POA: Diagnosis not present

## 2018-05-05 ENCOUNTER — Telehealth: Payer: Self-pay | Admitting: Nurse Practitioner

## 2018-05-05 NOTE — Telephone Encounter (Signed)
Phone call placed to pt.  Left vm re: refill request; advised that Gabapentin was last ordered 06/10/17 # 90; RF x 3; since pt. has TOC appt. on 1/27, she should have enough supply to get to that appt, and then can request refill.  Advised to call office if further concerns regarding this medication.

## 2018-05-05 NOTE — Telephone Encounter (Signed)
Copied from [place] [id number]. Topic: Quick Communication - Rx Refill/Question >> May 05, 2018  8:40 AM Sheran Luz wrote: Medication: gabapentin (NEURONTIN) 400 MG capsule  Has the patient contacted their pharmacy? Yes, advised to contact office. Patient has TOC appointment scheduled for 01/27. Preferred Pharmacy (with phone number or street name):[id number] [address], [place] [phone] (Phone) [phone] (Fax)

## 2018-05-10 ENCOUNTER — Encounter: Payer: Self-pay | Admitting: Nurse Practitioner

## 2018-05-10 ENCOUNTER — Other Ambulatory Visit: Payer: Self-pay

## 2018-05-10 ENCOUNTER — Ambulatory Visit (INDEPENDENT_AMBULATORY_CARE_PROVIDER_SITE_OTHER): Payer: PPO | Admitting: Nurse Practitioner

## 2018-05-10 VITALS — BP 107/70 | HR 52 | Temp 98.1°F | Ht 64.6 in | Wt 113.0 lb

## 2018-05-10 DIAGNOSIS — Z13220 Encounter for screening for lipoid disorders: Secondary | ICD-10-CM

## 2018-05-10 DIAGNOSIS — G4762 Sleep related leg cramps: Secondary | ICD-10-CM | POA: Diagnosis not present

## 2018-05-10 MED ORDER — ROPINIROLE HCL 0.25 MG PO TABS: 0.2500 mg | ORAL_TABLET | Freq: Every day | ORAL | 3 refills | Status: DC

## 2018-05-10 MED ORDER — GABAPENTIN 400 MG PO CAPS: 400.0000 mg | ORAL_CAPSULE | Freq: Every day | ORAL | 3 refills | Status: DC

## 2018-05-10 MED ORDER — GABAPENTIN 400 MG PO CAPS: 400.0000 mg | ORAL_CAPSULE | Freq: Every day | ORAL | 0 refills | Status: DC

## 2018-05-10 NOTE — Patient Instructions (Addendum)
Ropinirole tablets What is this medicine? ROPINIROLE (roe PIN i role) is used to treat the symptoms of Parkinson's disease. It helps to improve muscle control and movement difficulties. It is also used for the treatment of Restless Legs Syndrome. This medicine may be used for other purposes; ask your health care provider or pharmacist if you have questions. COMMON BRAND NAME(S): Requip What should I tell my health care provider before I take this medicine? They need to know if you have any of these conditions: -dizzy or fainting spells -heart disease -high blood pressure -kidney disease -liver disease -low blood pressure -sleeping problems -an unusual or allergic reaction to ropinirole, other medicines, foods, dyes, or preservatives -pregnant or trying to get pregnant -breast-feeding How should I use this medicine? Take this medicine by mouth with a glass of water. Follow the directions on the prescription label. You can take it with or without food. If it upsets your stomach, take it with food. Take your doses at regular intervals. Do not take your medicine more often than directed. Do not stop taking this medicine except on your doctor's advice. Stopping this medicine too quickly may cause serious side effects. Talk to your pediatrician regarding the use of this medicine in children. Special care may be needed. Overdosage: If you think you have taken too much of this medicine contact a poison control center or emergency room at once. NOTE: This medicine is only for you. Do not share this medicine with others. What if I miss a dose? If you miss a dose, take it as soon as you can. If it is almost time for your next dose, take only that dose. Do not take double or extra doses. What may interact with this medicine? -ciprofloxacin -female hormones, like estrogens and birth control pills -medicines for depression, anxiety, or psychotic  disturbances -metoclopramide -mexiletine -norfloxacin -omeprazole This list may not describe all possible interactions. Give your health care provider a list of all the medicines, herbs, non-prescription drugs, or dietary supplements you use. Also tell them if you smoke, drink alcohol, or use illegal drugs. Some items may interact with your medicine. What should I watch for while using this medicine? Visit your doctor or health care professional for regular checks on your progress. It may be several weeks or months before you feel the full effect of this medicine. You may get drowsy or dizzy. Do not drive, use machinery, or do anything that needs mental alertness until you know how this drug affects you. Do not stand or sit up quickly, especially if you are an older patient. This reduces the risk of dizzy or fainting spells. Alcohol can increase possible dizziness. Avoid alcoholic drinks. If you find that you have sudden feelings of wanting to sleep during normal activities, like cooking, watching television, or while driving or riding in a car, you should contact your health care professional. Your mouth may get dry. Chewing sugarless gum or sucking hard candy, and drinking plenty of water may help. Contact your doctor if the problem does not go away or is severe. There have been reports of increased sexual urges or other strong urges such as gambling while taking some medicines for Parkinson's disease. If you experience any of these urges while taking this medicine, you should report it to your health care provider as soon as possible. You should check your skin often for changes to moles and new growths while taking this medicine. Call your doctor if you notice any of these changes. What  side effects may I notice from receiving this medicine? Side effects that you should report to your doctor or health care professional as soon as possible: -allergic reactions like skin rash, itching or hives,  swelling of the face, lips, or tongue -changes in vision -chest pain -confusion -falling asleep during normal activities like driving -fast, irregular heartbeat -feeling faint or lightheaded, falls -hallucination, loss of contact with reality -joint or muscle pain -loss of bladder control -loss of memory -new or increased gambling urges, sexual urges, uncontrolled spending, binge or compulsive eating, or other urges -pain, tingling, numbness in the hands or feet -shortness of breath, troubled breathing, tightness in chest, or wheezing -signs and symptoms of low blood pressure like dizziness; feeling faint or lightheaded, falls; unusually weak or tired -swelling of the ankles, feet, hands -uncontrollable head, mouth, neck, arm, or leg movements -vomiting Side effects that usually do not require medical attention (report to your doctor or health care professional if they continue or are bothersome): -dizziness -drowsiness -headache -increased sweating -nausea -tremors This list may not describe all possible side effects. Call your doctor for medical advice about side effects. You may report side effects to FDA at 1-800-FDA-1088. Where should I keep my medicine? Keep out of the reach of children. Store at room temperature between 20 and 25 degrees C (68 and 77 degrees F). Protect from light and moisture. Keep container tightly closed. Throw away any unused medicine after the expiration date. NOTE: This sheet is a summary. It may not cover all possible information. If you have questions about this medicine, talk to your doctor, pharmacist, or health care provider.  2019 Elsevier/Gold Standard (2015-09-17 10:58:05)  Restless Legs Syndrome Restless legs syndrome is a condition that causes uncomfortable feelings or sensations in the legs, especially while sitting or lying down. The sensations usually cause an overwhelming urge to move the legs. The arms can also sometimes be affected. The  condition can range from mild to severe. The symptoms often interfere with a person's ability to sleep. What are the causes? The cause of this condition is not known. What increases the risk? The following factors may make you more likely to develop this condition:  Being older than 50.  Pregnancy.  Being a woman. In general, the condition is more common in women than in men.  A family history of the condition.  Having iron deficiency.  Overuse of caffeine, nicotine, or alcohol.  Certain medical conditions, such as kidney disease, Parkinson's disease, or nerve damage.  Certain medicines, such as those for high blood pressure, nausea, colds, allergies, depression, and some heart conditions. What are the signs or symptoms? The main symptom of this condition is uncomfortable sensations in the legs, such as:  Pulling.  Tingling.  Prickling.  Throbbing.  Crawling.  Burning. Usually, the sensations:  Affect both sides of the body.  Are worse when you sit or lie down.  Are worse at night. These may wake you up or make it difficult to fall asleep.  Make you have a strong urge to move your legs.  Are temporarily relieved by moving your legs. The arms can also be affected, but this is rare. People who have this condition often have tiredness during the day because of their lack of sleep at night. How is this diagnosed? This condition may be diagnosed based on:  Your symptoms.  Blood tests. In some cases, you may be monitored in a sleep lab by a specialist (a sleep study). This can detect  any disruptions in your sleep. How is this treated? This condition is treated by managing the symptoms. This may include:  Lifestyle changes, such as exercising, using relaxation techniques, and avoiding caffeine, alcohol, or tobacco.  Medicines. Anti-seizure medicines may be tried first. Follow these instructions at home:     General instructions  Take over-the-counter and  prescription medicines only as told by your health care provider.  Use methods to help relieve the uncomfortable sensations, such as: ? Massaging your legs. ? Walking or stretching. ? Taking a cold or hot bath.  Keep all follow-up visits as told by your health care provider. This is important. Lifestyle  Practice good sleep habits. For example, go to bed and get up at the same time every day. Most adults should get 7-9 hours of sleep each night.  Exercise regularly. Try to get at least 30 minutes of exercise most days of the week.  Practice ways of relaxing, such as yoga or meditation.  Avoid caffeine and alcohol.  Do not use any products that contain nicotine or tobacco, such as cigarettes and e-cigarettes. If you need help quitting, ask your health care provider. Contact a health care provider if:  Your symptoms get worse or they do not improve with treatment. Summary  Restless legs syndrome is a condition that causes uncomfortable feelings or sensations in the legs, especially while sitting or lying down.  The symptoms often interfere with a person's ability to sleep.  This condition is treated by managing the symptoms. You may need to make lifestyle changes or take medicines. This information is not intended to replace advice given to you by your health care provider. Make sure you discuss any questions you have with your health care provider. Document Released: 03/21/2002 Document Revised: 04/20/2017 Document Reviewed: 04/20/2017 Elsevier Interactive Patient Education  2019 [employer].

## 2018-05-10 NOTE — Progress Notes (Signed)
BP 107/70   Pulse (!) 52   Temp 98.1 F (36.7 C) (Oral)   Ht 5' 4.6" (1.641 m)   Wt 113 lb (51.3 kg)   SpO2 99%   BMI 19.04 kg/m    Subjective:    Patient ID: Cassandra [name], female    DOB: 1940/09/20, 78 y.o.   MRN: [id number]  HPI: Cassandra [name] is a 78 y.o. female  Chief Complaint  Patient presents with  . Medication Refill    gabapentin   NOCTURNAL LEG CRAMPS: Gabapentin increased to 400 MG (doubled) at her last visit 06/10/17.  Patient reports she has had ongoing leg pain at night for several years, since she started seeing previous PCP (2 years).  States the increase in Gabapentin has not helped and the pain is becoming more uncomfortable at night.  Reports that sometimes she wakes up in middle of night and takes another dose of Gabapentin (equals taking 800 MG on some nights).  States that several years ago she had similar discomfort and was seen by ortho who told her "my back was okay and I did not need to see them anymore" + "I went and saw someone else who told me the nerves in my left leg were dead".  She reports the discomfort at night is a cramping/burning discomfort that feels better if she moves her legs, does not have discomfort during day.  At times this wakes her up.  Moving legs and at times extra dose of Gabapentin make this discomfort better, but often she reports nothing makes it better.  Does take Magnesium daily.  Endorses drinking caffeine at night, which has been advised not to do.    Relevant past medical, surgical, family and social history reviewed and updated as indicated. Interim medical history since our last visit reviewed. Allergies and medications reviewed and updated.  Review of Systems  Constitutional: Negative for activity change, appetite change, diaphoresis, fatigue and fever.  Respiratory: Negative for cough, chest tightness and shortness of breath.   Cardiovascular: Negative for chest pain, palpitations and leg swelling.    Gastrointestinal: Negative for abdominal distention, abdominal pain, constipation, diarrhea, nausea and vomiting.  Endocrine: Negative for cold intolerance, heat intolerance, polydipsia, polyphagia and polyuria.  Musculoskeletal: Positive for arthralgias (cramping at night).  Neurological: Negative for dizziness, syncope, weakness, light-headedness, numbness and headaches.  Psychiatric/Behavioral: Negative.     Per HPI unless specifically indicated above     Objective:    BP 107/70   Pulse (!) 52   Temp 98.1 F (36.7 C) (Oral)   Ht 5' 4.6" (1.641 m)   Wt 113 lb (51.3 kg)   SpO2 99%   BMI 19.04 kg/m   Wt Readings from Last 3 Encounters:  05/10/18 113 lb (51.3 kg)  06/10/17 118 lb (53.5 kg)  02/20/17 119 lb 8 oz (54.2 kg)    Physical Exam Vitals signs and nursing note reviewed.  Constitutional:      General: She is awake.     Appearance: She is well-developed.  HENT:     Head: Normocephalic.     Right Ear: Hearing normal.     Left Ear: Hearing normal.     Nose: Nose normal.     Mouth/Throat:     Mouth: Mucous membranes are moist.  Eyes:     General: Lids are normal.        Right eye: No discharge.        Left eye: No discharge.  Conjunctiva/sclera: Conjunctivae normal.     Pupils: Pupils are equal, round, and reactive to light.  Neck:     Musculoskeletal: Normal range of motion and neck supple.     Thyroid: No thyromegaly.     Vascular: No carotid bruit or JVD.  Cardiovascular:     Rate and Rhythm: Normal rate and regular rhythm.     Pulses:          Dorsalis pedis pulses are 1+ on the right side and 1+ on the left side.       Posterior tibial pulses are 1+ on the right side and 1+ on the left side.     Heart sounds: Normal heart sounds. No murmur. No gallop.   Pulmonary:     Effort: Pulmonary effort is normal.     Breath sounds: Normal breath sounds.  Abdominal:     General: Bowel sounds are normal.     Palpations: Abdomen is soft. There is no  hepatomegaly or splenomegaly.  Musculoskeletal:     Right lower leg: No edema.     Left lower leg: No edema.  Feet:     Right foot:     Protective Sensation: 10 sites tested. 10 sites sensed.     Skin integrity: Skin integrity normal.     Toenail Condition: Right toenails are normal.     Left foot:     Protective Sensation: 10 sites tested. 10 sites sensed.     Skin integrity: Skin integrity normal.     Toenail Condition: Left toenails are normal.  Lymphadenopathy:     Cervical: No cervical adenopathy.  Skin:    General: Skin is warm and dry.  Neurological:     Mental Status: She is alert and oriented to person, place, and time.  Psychiatric:        Attention and Perception: Attention normal.        Mood and Affect: Mood normal.        Behavior: Behavior normal. Behavior is cooperative.        Thought Content: Thought content normal.        Judgment: Judgment normal.     Results for orders placed or performed in visit on 06/10/17  Lipid Panel w/o Chol/HDL Ratio  Result Value Ref Range   Cholesterol, Total 184 100 - 199 mg/dL   Triglycerides 48 0 - 149 mg/dL   HDL 80 >39 mg/dL   VLDL Cholesterol Cal 10 5 - 40 mg/dL   LDL Calculated 94 0 - 99 mg/dL  Comprehensive metabolic panel  Result Value Ref Range   Glucose 91 65 - 99 mg/dL   BUN 22 8 - 27 mg/dL   Creatinine, Ser 0.75 0.57 - 1.00 mg/dL   GFR calc non Af Amer 79 >59 mL/min/1.73   GFR calc Af Amer 91 >59 mL/min/1.73   BUN/Creatinine Ratio 29 (H) 12 - 28   Sodium 142 134 - 144 mmol/L   Potassium 4.6 3.5 - 5.2 mmol/L   Chloride 107 (H) 96 - 106 mmol/L   CO2 22 20 - 29 mmol/L   Calcium 9.5 8.7 - 10.3 mg/dL   Total Protein 6.3 6.0 - 8.5 g/dL   Albumin 4.5 3.5 - 4.8 g/dL   Globulin, Total 1.8 1.5 - 4.5 g/dL   Albumin/Globulin Ratio 2.5 (H) 1.2 - 2.2   Bilirubin Total 0.3 0.0 - 1.2 mg/dL   Alkaline Phosphatase 79 39 - 117 IU/L   AST 22 0 - 40 IU/L  ALT 17 0 - 32 IU/L  Ferritin  Result Value Ref Range   Ferritin  46 15 - 150 ng/mL      Assessment & Plan:   Problem List Items Addressed This Visit      Other   Nocturnal leg cramps - Primary    Chronic, ongoing.  Poor relief with Gabapentin.  Will discontinue this and trial Ropinirole 0.25 MG at HS.  Instructed on things to avoid prior to bedtime that may aggravate cramping.  Neurology consult for further recommendations d/t ongoing issues.  CBC, CMP, Magnesium, anemia panel, and thyroid panel today.  Return in one month.      Relevant Orders   Ambulatory referral to Neurology   Magnesium   Thyroid Panel With TSH   Comp Met (CMET)   CBC w/Diff   Anemia panel    Other Visit Diagnoses    Screening cholesterol level       Lipid panel today.   Relevant Orders   Lipid Profile       Follow up plan: Return in about 4 weeks (around 06/07/2018) for Restless Leg Syndrome.

## 2018-05-10 NOTE — Assessment & Plan Note (Addendum)
Chronic, ongoing.  Poor relief with Gabapentin.  Will discontinue this and trial Ropinirole 0.25 MG at HS.  Instructed on things to avoid prior to bedtime that may aggravate cramping.  Neurology consult for further recommendations d/t ongoing issues.  CBC, CMP, Magnesium, anemia panel, and thyroid panel today.  Return in one month.

## 2018-05-11 ENCOUNTER — Telehealth: Payer: Self-pay | Admitting: Nurse Practitioner

## 2018-05-11 LAB — CBC WITH DIFFERENTIAL/PLATELET
BASOS ABS: 0 10*3/uL (ref 0.0–0.2)
BASOS: 1 %
EOS (ABSOLUTE): 0.1 10*3/uL (ref 0.0–0.4)
Eos: 2 %
HEMOGLOBIN: 14 g/dL (ref 11.1–15.9)
IMMATURE GRANS (ABS): 0 10*3/uL (ref 0.0–0.1)
Immature Granulocytes: 0 %
LYMPHS: 32 %
Lymphocytes Absolute: 1.6 10*3/uL (ref 0.7–3.1)
MCH: 26.9 pg (ref 26.6–33.0)
MCHC: 31.9 g/dL (ref 31.5–35.7)
MCV: 84 fL (ref 79–97)
MONOCYTES: 6 %
Monocytes Absolute: 0.3 10*3/uL (ref 0.1–0.9)
Neutrophils Absolute: 2.8 10*3/uL (ref 1.4–7.0)
Neutrophils: 59 %
Platelets: 283 10*3/uL (ref 150–450)
RBC: 5.21 x10E6/uL (ref 3.77–5.28)
RDW: 14.6 % (ref 11.7–15.4)
WBC: 4.9 10*3/uL (ref 3.4–10.8)

## 2018-05-11 LAB — ANEMIA PANEL
Ferritin: 46 ng/mL (ref 15–150)
Folate, Hemolysate: 398 ng/mL
Folate, RBC: 907 ng/mL (ref >498)
Hematocrit: 43.9 % (ref 34.0–46.6)
IRON SATURATION: 16 % (ref 15–55)
IRON: 59 ug/dL (ref 27–139)
Retic Ct Pct: 0.6 % (ref 0.6–2.6)
TIBC: 369 ug/dL (ref 250–450)
UIBC: 310 ug/dL (ref 118–369)
Vitamin B-12: 416 pg/mL (ref 232–1245)

## 2018-05-11 LAB — COMPREHENSIVE METABOLIC PANEL
A/G RATIO: 3.1 — AB (ref 1.2–2.2)
ALK PHOS: 66 IU/L (ref 39–117)
ALT: 17 IU/L (ref 0–32)
AST: 25 IU/L (ref 0–40)
Albumin: 4.9 g/dL — ABNORMAL HIGH (ref 3.7–4.7)
BUN/Creatinine Ratio: 32 — ABNORMAL HIGH (ref 12–28)
BUN: 25 mg/dL (ref 8–27)
Bilirubin Total: 0.3 mg/dL (ref 0.0–1.2)
CALCIUM: 10.4 mg/dL — AB (ref 8.7–10.3)
CO2: 26 mmol/L (ref 20–29)
CREATININE: 0.78 mg/dL (ref 0.57–1.00)
Chloride: 102 mmol/L (ref 96–106)
GFR calc Af Amer: 86 mL/min/{1.73_m2} (ref >59)
GFR calc non Af Amer: 75 mL/min/{1.73_m2} (ref >59)
GLOBULIN, TOTAL: 1.6 g/dL (ref 1.5–4.5)
Glucose: 79 mg/dL (ref 65–99)
Potassium: 4.8 mmol/L (ref 3.5–5.2)
Sodium: 141 mmol/L (ref 134–144)
Total Protein: 6.5 g/dL (ref 6.0–8.5)

## 2018-05-11 LAB — MAGNESIUM: Magnesium: 2.4 mg/dL — ABNORMAL HIGH (ref 1.6–2.3)

## 2018-05-11 LAB — THYROID PANEL WITH TSH
FREE THYROXINE INDEX: 1.6 (ref 1.2–4.9)
T3 UPTAKE RATIO: 21 % — AB (ref 24–39)
T4 TOTAL: 7.4 ug/dL (ref 4.5–12.0)
TSH: 1.81 u[IU]/mL (ref 0.450–4.500)

## 2018-05-11 LAB — LIPID PANEL
CHOL/HDL RATIO: 2.4 ratio (ref 0.0–4.4)
Cholesterol, Total: 212 mg/dL — ABNORMAL HIGH (ref 100–199)
HDL: 88 mg/dL (ref >39)
LDL CALC: 111 mg/dL — AB (ref 0–99)
TRIGLYCERIDES: 67 mg/dL (ref 0–149)
VLDL CHOLESTEROL CAL: 13 mg/dL (ref 5–40)

## 2018-05-11 NOTE — Telephone Encounter (Signed)
Spoke to patient via telephone to review labs.  Discussed elevation in cholesterol level and will further discuss this with her at next visit in 4 weeks.  She is going to stop taking Magnesium at this time, which she had been taking nightly.  Reports she had less cramping in legs last night with initiation of Requip.  She has follow-up in 4 weeks for further assessment.

## 2018-06-10 ENCOUNTER — Encounter: Payer: Self-pay | Admitting: Nurse Practitioner

## 2018-06-10 ENCOUNTER — Ambulatory Visit (INDEPENDENT_AMBULATORY_CARE_PROVIDER_SITE_OTHER): Payer: PPO | Admitting: Nurse Practitioner

## 2018-06-10 DIAGNOSIS — M5414 Radiculopathy, thoracic region: Secondary | ICD-10-CM | POA: Insufficient documentation

## 2018-06-10 DIAGNOSIS — G4762 Sleep related leg cramps: Secondary | ICD-10-CM | POA: Diagnosis not present

## 2018-06-10 MED ORDER — ROPINIROLE HCL 0.5 MG PO TABS: 0.5000 mg | ORAL_TABLET | Freq: Every day | ORAL | 5 refills | Status: DC

## 2018-06-10 NOTE — Assessment & Plan Note (Signed)
Chronic, improved pain with Ropinirole 0.5 MG at night.  Will continue this dose and have return in 6 months for f/u.  New script sent.

## 2018-06-10 NOTE — Patient Instructions (Signed)
Ropinirole tablets What is this medicine? ROPINIROLE (roe PIN i role) is used to treat the symptoms of Parkinson's disease. It helps to improve muscle control and movement difficulties. It is also used for the treatment of Restless Legs Syndrome. This medicine may be used for other purposes; ask your health care provider or pharmacist if you have questions. COMMON BRAND NAME(S): Requip What should I tell my health care provider before I take this medicine? They need to know if you have any of these conditions: -dizzy or fainting spells -heart disease -high blood pressure -kidney disease -liver disease -low blood pressure -sleeping problems -an unusual or allergic reaction to ropinirole, other medicines, foods, dyes, or preservatives -pregnant or trying to get pregnant -breast-feeding How should I use this medicine? Take this medicine by mouth with a glass of water. Follow the directions on the prescription label. You can take it with or without food. If it upsets your stomach, take it with food. Take your doses at regular intervals. Do not take your medicine more often than directed. Do not stop taking this medicine except on your doctor's advice. Stopping this medicine too quickly may cause serious side effects. Talk to your pediatrician regarding the use of this medicine in children. Special care may be needed. Overdosage: If you think you have taken too much of this medicine contact a poison control center or emergency room at once. NOTE: This medicine is only for you. Do not share this medicine with others. What if I miss a dose? If you miss a dose, take it as soon as you can. If it is almost time for your next dose, take only that dose. Do not take double or extra doses. What may interact with this medicine? -ciprofloxacin -female hormones, like estrogens and birth control pills -medicines for depression, anxiety, or psychotic  disturbances -metoclopramide -mexiletine -norfloxacin -omeprazole This list may not describe all possible interactions. Give your health care provider a list of all the medicines, herbs, non-prescription drugs, or dietary supplements you use. Also tell them if you smoke, drink alcohol, or use illegal drugs. Some items may interact with your medicine. What should I watch for while using this medicine? Visit your doctor or health care professional for regular checks on your progress. It may be several weeks or months before you feel the full effect of this medicine. You may get drowsy or dizzy. Do not drive, use machinery, or do anything that needs mental alertness until you know how this drug affects you. Do not stand or sit up quickly, especially if you are an older patient. This reduces the risk of dizzy or fainting spells. Alcohol can increase possible dizziness. Avoid alcoholic drinks. If you find that you have sudden feelings of wanting to sleep during normal activities, like cooking, watching television, or while driving or riding in a car, you should contact your health care professional. Your mouth may get dry. Chewing sugarless gum or sucking hard candy, and drinking plenty of water may help. Contact your doctor if the problem does not go away or is severe. There have been reports of increased sexual urges or other strong urges such as gambling while taking some medicines for Parkinson's disease. If you experience any of these urges while taking this medicine, you should report it to your health care provider as soon as possible. You should check your skin often for changes to moles and new growths while taking this medicine. Call your doctor if you notice any of these changes. What  side effects may I notice from receiving this medicine? Side effects that you should report to your doctor or health care professional as soon as possible: -allergic reactions like skin rash, itching or hives,  swelling of the face, lips, or tongue -changes in vision -chest pain -confusion -falling asleep during normal activities like driving -fast, irregular heartbeat -feeling faint or lightheaded, falls -hallucination, loss of contact with reality -joint or muscle pain -loss of bladder control -loss of memory -new or increased gambling urges, sexual urges, uncontrolled spending, binge or compulsive eating, or other urges -pain, tingling, numbness in the hands or feet -shortness of breath, troubled breathing, tightness in chest, or wheezing -signs and symptoms of low blood pressure like dizziness; feeling faint or lightheaded, falls; unusually weak or tired -swelling of the ankles, feet, hands -uncontrollable head, mouth, neck, arm, or leg movements -vomiting Side effects that usually do not require medical attention (report to your doctor or health care professional if they continue or are bothersome): -dizziness -drowsiness -headache -increased sweating -nausea -tremors This list may not describe all possible side effects. Call your doctor for medical advice about side effects. You may report side effects to FDA at 1-800-FDA-1088. Where should I keep my medicine? Keep out of the reach of children. Store at room temperature between 20 and 25 degrees C (68 and 77 degrees F). Protect from light and moisture. Keep container tightly closed. Throw away any unused medicine after the expiration date. NOTE: This sheet is a summary. It may not cover all possible information. If you have questions about this medicine, talk to your doctor, pharmacist, or health care provider.  2019 Elsevier/Gold Standard (2015-09-17 10:58:05) Restless Legs Syndrome Restless legs syndrome is a condition that causes uncomfortable feelings or sensations in the legs, especially while sitting or lying down. The sensations usually cause an overwhelming urge to move the legs. The arms can also sometimes be affected. The  condition can range from mild to severe. The symptoms often interfere with a person's ability to sleep. What are the causes? The cause of this condition is not known. What increases the risk? The following factors may make you more likely to develop this condition:  Being older than 50.  Pregnancy.  Being a woman. In general, the condition is more common in women than in men.  A family history of the condition.  Having iron deficiency.  Overuse of caffeine, nicotine, or alcohol.  Certain medical conditions, such as kidney disease, Parkinson's disease, or nerve damage.  Certain medicines, such as those for high blood pressure, nausea, colds, allergies, depression, and some heart conditions. What are the signs or symptoms? The main symptom of this condition is uncomfortable sensations in the legs, such as:  Pulling.  Tingling.  Prickling.  Throbbing.  Crawling.  Burning. Usually, the sensations:  Affect both sides of the body.  Are worse when you sit or lie down.  Are worse at night. These may wake you up or make it difficult to fall asleep.  Make you have a strong urge to move your legs.  Are temporarily relieved by moving your legs. The arms can also be affected, but this is rare. People who have this condition often have tiredness during the day because of their lack of sleep at night. How is this diagnosed? This condition may be diagnosed based on:  Your symptoms.  Blood tests. In some cases, you may be monitored in a sleep lab by a specialist (a sleep study). This can detect any  disruptions in your sleep. How is this treated? This condition is treated by managing the symptoms. This may include:  Lifestyle changes, such as exercising, using relaxation techniques, and avoiding caffeine, alcohol, or tobacco.  Medicines. Anti-seizure medicines may be tried first. Follow these instructions at home:     General instructions  Take over-the-counter and  prescription medicines only as told by your health care provider.  Use methods to help relieve the uncomfortable sensations, such as: ? Massaging your legs. ? Walking or stretching. ? Taking a cold or hot bath.  Keep all follow-up visits as told by your health care provider. This is important. Lifestyle  Practice good sleep habits. For example, go to bed and get up at the same time every day. Most adults should get 7-9 hours of sleep each night.  Exercise regularly. Try to get at least 30 minutes of exercise most days of the week.  Practice ways of relaxing, such as yoga or meditation.  Avoid caffeine and alcohol.  Do not use any products that contain nicotine or tobacco, such as cigarettes and e-cigarettes. If you need help quitting, ask your health care provider. Contact a health care provider if:  Your symptoms get worse or they do not improve with treatment. Summary  Restless legs syndrome is a condition that causes uncomfortable feelings or sensations in the legs, especially while sitting or lying down.  The symptoms often interfere with a person's ability to sleep.  This condition is treated by managing the symptoms. You may need to make lifestyle changes or take medicines. This information is not intended to replace advice given to you by your health care provider. Make sure you discuss any questions you have with your health care provider. Document Released: 03/21/2002 Document Revised: 04/20/2017 Document Reviewed: 04/20/2017 Elsevier Interactive Patient Education  2019 [employer].

## 2018-06-10 NOTE — Progress Notes (Signed)
BP 109/69   Pulse 69   Temp 98.1 F (36.7 C) (Oral)   Ht 5' 4.6" (1.641 m)   Wt 114 lb (51.7 kg)   SpO2 99%   BMI 19.21 kg/m    Subjective:    Patient ID: Cassandra [name], female    DOB: 02-22-1941, 78 y.o.   MRN: [id number]  HPI: Cassandra [name] is a 78 y.o. female  Chief Complaint  Patient presents with  . Restless leg    Pt states some nights she must take 2 tablets at night to calm legs.    RESTLESS LEG: Discontinued Gabapentin and started on Ropinirole on 05/10/17 at 0.25MG and this has improved RLS, has been taking two tablets (0.5MG) and finds this works better.  States she is sleeping better through the night with less episodes of waking and less leg discomfort.  States "it is a whole lot better".  Denies daytime leg pain.    Relevant past medical, surgical, family and social history reviewed and updated as indicated. Interim medical history since our last visit reviewed. Allergies and medications reviewed and updated.  Review of Systems  Constitutional: Negative for activity change, appetite change, diaphoresis, fatigue and fever.  Respiratory: Negative for cough, chest tightness and shortness of breath.   Cardiovascular: Negative for chest pain, palpitations and leg swelling.  Gastrointestinal: Negative.   Neurological: Negative for dizziness, syncope, weakness, light-headedness, numbness and headaches.  Psychiatric/Behavioral: Negative.     Per HPI unless specifically indicated above     Objective:    BP 109/69   Pulse 69   Temp 98.1 F (36.7 C) (Oral)   Ht 5' 4.6" (1.641 m)   Wt 114 lb (51.7 kg)   SpO2 99%   BMI 19.21 kg/m   Wt Readings from Last 3 Encounters:  06/10/18 114 lb (51.7 kg)  05/10/18 113 lb (51.3 kg)  06/10/17 118 lb (53.5 kg)    Physical Exam Vitals signs and nursing note reviewed.  Constitutional:      Appearance: She is well-developed.  HENT:     Head: Normocephalic.  Eyes:     General:        Right eye: No discharge.         Left eye: No discharge.     Conjunctiva/sclera: Conjunctivae normal.     Pupils: Pupils are equal, round, and reactive to light.  Neck:     Musculoskeletal: Normal range of motion and neck supple.     Thyroid: No thyromegaly.     Vascular: No carotid bruit or JVD.  Cardiovascular:     Rate and Rhythm: Normal rate and regular rhythm.     Heart sounds: Normal heart sounds.  Pulmonary:     Effort: Pulmonary effort is normal.     Breath sounds: Normal breath sounds.  Abdominal:     General: Bowel sounds are normal.     Palpations: Abdomen is soft.  Musculoskeletal:     Right lower leg: No edema.     Left lower leg: No edema.  Lymphadenopathy:     Cervical: No cervical adenopathy.  Skin:    General: Skin is warm and dry.  Neurological:     Mental Status: She is alert and oriented to person, place, and time.  Psychiatric:        Mood and Affect: Mood normal.        Behavior: Behavior normal.        Thought Content: Thought content normal.  Judgment: Judgment normal.     Results for orders placed or performed in visit on 05/10/18  Magnesium  Result Value Ref Range   Magnesium 2.4 (H) 1.6 - 2.3 mg/dL  Thyroid Panel With TSH  Result Value Ref Range   TSH 1.810 0.450 - 4.500 uIU/mL   T4, Total 7.4 4.5 - 12.0 ug/dL   T3 Uptake Ratio 21 (L) 24 - 39 %   Free Thyroxine Index 1.6 1.2 - 4.9  Comp Met (CMET)  Result Value Ref Range   Glucose 79 65 - 99 mg/dL   BUN 25 8 - 27 mg/dL   Creatinine, Ser 0.78 0.57 - 1.00 mg/dL   GFR calc non Af Amer 75 >59 mL/min/1.73   GFR calc Af Amer 86 >59 mL/min/1.73   BUN/Creatinine Ratio 32 (H) 12 - 28   Sodium 141 134 - 144 mmol/L   Potassium 4.8 3.5 - 5.2 mmol/L   Chloride 102 96 - 106 mmol/L   CO2 26 20 - 29 mmol/L   Calcium 10.4 (H) 8.7 - 10.3 mg/dL   Total Protein 6.5 6.0 - 8.5 g/dL   Albumin 4.9 (H) 3.7 - 4.7 g/dL   Globulin, Total 1.6 1.5 - 4.5 g/dL   Albumin/Globulin Ratio 3.1 (H) 1.2 - 2.2   Bilirubin Total 0.3 0.0 - 1.2  mg/dL   Alkaline Phosphatase 66 39 - 117 IU/L   AST 25 0 - 40 IU/L   ALT 17 0 - 32 IU/L  CBC w/Diff  Result Value Ref Range   WBC 4.9 3.4 - 10.8 x10E3/uL   RBC 5.21 3.77 - 5.28 x10E6/uL   Hemoglobin 14.0 11.1 - 15.9 g/dL   MCV 84 79 - 97 fL   MCH 26.9 26.6 - 33.0 pg   MCHC 31.9 31.5 - 35.7 g/dL   RDW 14.6 11.7 - 15.4 %   Platelets 283 150 - 450 x10E3/uL   Neutrophils 59 Not Estab. %   Lymphs 32 Not Estab. %   Monocytes 6 Not Estab. %   Eos 2 Not Estab. %   Basos 1 Not Estab. %   Neutrophils Absolute 2.8 1.4 - 7.0 x10E3/uL   Lymphocytes Absolute 1.6 0.7 - 3.1 x10E3/uL   Monocytes Absolute 0.3 0.1 - 0.9 x10E3/uL   EOS (ABSOLUTE) 0.1 0.0 - 0.4 x10E3/uL   Basophils Absolute 0.0 0.0 - 0.2 x10E3/uL   Immature Granulocytes 0 Not Estab. %   Immature Grans (Abs) 0.0 0.0 - 0.1 x10E3/uL  Anemia panel  Result Value Ref Range   Total Iron Binding Capacity 369 250 - 450 ug/dL   UIBC 310 118 - 369 ug/dL   Iron 59 27 - 139 ug/dL   Iron Saturation 16 15 - 55 %   Vitamin B-12 416 232 - 1,245 pg/mL   Folate, Hemolysate 398.0 Not Estab. ng/mL   Hematocrit 43.9 34.0 - 46.6 %   Folate, RBC 907 >498 ng/mL   Ferritin 46 15 - 150 ng/mL   Retic Ct Pct 0.6 0.6 - 2.6 %  Lipid Profile  Result Value Ref Range   Cholesterol, Total 212 (H) 100 - 199 mg/dL   Triglycerides 67 0 - 149 mg/dL   HDL 88 >39 mg/dL   VLDL Cholesterol Cal 13 5 - 40 mg/dL   LDL Calculated 111 (H) 0 - 99 mg/dL   Chol/HDL Ratio 2.4 0.0 - 4.4 ratio      Assessment & Plan:   Problem List Items Addressed This Visit  Other   Nocturnal leg cramps    Chronic, improved pain with Ropinirole 0.5 MG at night.  Will continue this dose and have return in 6 months for f/u.  New script sent.          Follow up plan: Return in about 6 months (around 12/09/2018) for RLS.

## 2018-07-12 DIAGNOSIS — M79605 Pain in left leg: Secondary | ICD-10-CM | POA: Diagnosis not present

## 2018-07-12 DIAGNOSIS — G250 Essential tremor: Secondary | ICD-10-CM | POA: Diagnosis not present

## 2018-07-12 DIAGNOSIS — M79604 Pain in right leg: Secondary | ICD-10-CM | POA: Diagnosis not present

## 2018-07-12 DIAGNOSIS — R252 Cramp and spasm: Secondary | ICD-10-CM | POA: Diagnosis not present

## 2018-07-21 ENCOUNTER — Other Ambulatory Visit: Payer: Self-pay | Admitting: Nurse Practitioner

## 2018-08-09 ENCOUNTER — Encounter: Payer: Self-pay | Admitting: Family Medicine

## 2018-08-09 ENCOUNTER — Ambulatory Visit (INDEPENDENT_AMBULATORY_CARE_PROVIDER_SITE_OTHER): Payer: PPO | Admitting: Family Medicine

## 2018-08-09 ENCOUNTER — Other Ambulatory Visit: Payer: Self-pay

## 2018-08-09 DIAGNOSIS — L03116 Cellulitis of left lower limb: Secondary | ICD-10-CM

## 2018-08-09 MED ORDER — DOXYCYCLINE HYCLATE 100 MG PO TABS: 100.0000 mg | ORAL_TABLET | Freq: Two times a day (BID) | ORAL | 0 refills | Status: DC

## 2018-08-09 MED ORDER — MUPIROCIN 2 % EX OINT: 1.0000 "application " | TOPICAL_OINTMENT | Freq: Two times a day (BID) | CUTANEOUS | 0 refills | Status: DC

## 2018-08-09 NOTE — Progress Notes (Signed)
There were no vitals taken for this visit.   Subjective:    Patient ID: Cassandra [name], female    DOB: January 21, 1941, 78 y.o.   MRN: [id number]  HPI: Cassandra [name] is a 78 y.o. female  Chief Complaint  Patient presents with  . Foot Pain    limb came down and hit top of her left foot. Swollen and sore   . This visit was completed via WebEx due to the restrictions of the COVID-19 pandemic. All issues as above were discussed and addressed. Physical exam was done as above through visual confirmation on WebEx. If it was felt that the patient should be evaluated in the office, they were directed there. The patient verbally consented to this visit. . Location of the patient: home . Location of the provider: home . Those involved with this call:  . Provider: Merrie Roof, PA-C . CMA: Yvonna Alanis, Lillie . Front Desk/Registration: Jill Side  . Time spent on call: 15 minutes with patient face to face via video conference. More than 50% of this time was spent in counseling and coordination of care. 5 minutes total spent in review of patient's record and preparation of their chart.  I verified patient identity using two factors (patient name and date of birth). Patient consents verbally to being seen via telemedicine visit today.   Presenting today with left foot pain, redness, swelling, drainage after a puncture injury from a limb falling down onto the foot about a week ago. 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 Soaking foot in peroxide and using neosporin with minimal relief. Denies fevers, chills, weakness, sweats, body aches.   Relevant past medical, surgical, family and social history reviewed and updated as indicated. Interim medical history since our last visit reviewed. Allergies and medications reviewed and updated.  Review of Systems  Per HPI unless specifically indicated above     Objective:    There were no  vitals taken for this visit.  Wt Readings from Last 3 Encounters:  06/10/18 114 lb (51.7 kg)  05/10/18 113 lb (51.3 kg)  06/10/17 118 lb (53.5 kg)    Physical Exam Vitals signs and nursing note reviewed.  Constitutional:      General: She is not in acute distress.    Appearance: Normal appearance.  HENT:     Head: Atraumatic.     Right Ear: External ear normal.     Left Ear: External ear normal.     Nose: Nose normal.     Mouth/Throat:     Mouth: Mucous membranes are moist.     Pharynx: Oropharynx is clear.  Eyes:     Extraocular Movements: Extraocular movements intact.     Conjunctiva/sclera: Conjunctivae normal.  Neck:     Musculoskeletal: Normal range of motion.  Cardiovascular:     Comments: Unable to assess via virtual visit Pulmonary:     Effort: Pulmonary effort is normal. No respiratory distress.  Musculoskeletal: Normal range of motion.  Skin:    General: Skin is dry.     Findings: No erythema.     Comments: Left foot dorsal aspect erythematous, edematous with 0.5 cm puncture wound present. Difficult to determine any active drainage through vide view  Neurological:     Mental Status: She is alert and oriented to person, place, and time.  Psychiatric:        Mood and Affect: Mood normal.  Thought Content: Thought content normal.        Judgment: Judgment normal.     Results for orders placed or performed in visit on 05/10/18  Magnesium  Result Value Ref Range   Magnesium 2.4 (H) 1.6 - 2.3 mg/dL  Thyroid Panel With TSH  Result Value Ref Range   TSH 1.810 0.450 - 4.500 uIU/mL   T4, Total 7.4 4.5 - 12.0 ug/dL   T3 Uptake Ratio 21 (L) 24 - 39 %   Free Thyroxine Index 1.6 1.2 - 4.9  Comp Met (CMET)  Result Value Ref Range   Glucose 79 65 - 99 mg/dL   BUN 25 8 - 27 mg/dL   Creatinine, Ser 0.78 0.57 - 1.00 mg/dL   GFR calc non Af Amer 75 >59 mL/min/1.73   GFR calc Af Amer 86 >59 mL/min/1.73   BUN/Creatinine Ratio 32 (H) 12 - 28   Sodium 141 134 - 144  mmol/L   Potassium 4.8 3.5 - 5.2 mmol/L   Chloride 102 96 - 106 mmol/L   CO2 26 20 - 29 mmol/L   Calcium 10.4 (H) 8.7 - 10.3 mg/dL   Total Protein 6.5 6.0 - 8.5 g/dL   Albumin 4.9 (H) 3.7 - 4.7 g/dL   Globulin, Total 1.6 1.5 - 4.5 g/dL   Albumin/Globulin Ratio 3.1 (H) 1.2 - 2.2   Bilirubin Total 0.3 0.0 - 1.2 mg/dL   Alkaline Phosphatase 66 39 - 117 IU/L   AST 25 0 - 40 IU/L   ALT 17 0 - 32 IU/L  CBC w/Diff  Result Value Ref Range   WBC 4.9 3.4 - 10.8 x10E3/uL   RBC 5.21 3.77 - 5.28 x10E6/uL   Hemoglobin 14.0 11.1 - 15.9 g/dL   MCV 84 79 - 97 fL   MCH 26.9 26.6 - 33.0 pg   MCHC 31.9 31.5 - 35.7 g/dL   RDW 14.6 11.7 - 15.4 %   Platelets 283 150 - 450 x10E3/uL   Neutrophils 59 Not Estab. %   Lymphs 32 Not Estab. %   Monocytes 6 Not Estab. %   Eos 2 Not Estab. %   Basos 1 Not Estab. %   Neutrophils Absolute 2.8 1.4 - 7.0 x10E3/uL   Lymphocytes Absolute 1.6 0.7 - 3.1 x10E3/uL   Monocytes Absolute 0.3 0.1 - 0.9 x10E3/uL   EOS (ABSOLUTE) 0.1 0.0 - 0.4 x10E3/uL   Basophils Absolute 0.0 0.0 - 0.2 x10E3/uL   Immature Granulocytes 0 Not Estab. %   Immature Grans (Abs) 0.0 0.0 - 0.1 x10E3/uL  Anemia panel  Result Value Ref Range   Total Iron Binding Capacity 369 250 - 450 ug/dL   UIBC 310 118 - 369 ug/dL   Iron 59 27 - 139 ug/dL   Iron Saturation 16 15 - 55 %   Vitamin B-12 416 232 - 1,245 pg/mL   Folate, Hemolysate 398.0 Not Estab. ng/mL   Hematocrit 43.9 34.0 - 46.6 %   Folate, RBC 907 >498 ng/mL   Ferritin 46 15 - 150 ng/mL   Retic Ct Pct 0.6 0.6 - 2.6 %  Lipid Profile  Result Value Ref Range   Cholesterol, Total 212 (H) 100 - 199 mg/dL   Triglycerides 67 0 - 149 mg/dL   HDL 88 >39 mg/dL   VLDL Cholesterol Cal 13 5 - 40 mg/dL   LDL Calculated 111 (H) 0 - 99 mg/dL   Chol/HDL Ratio 2.4 0.0 - 4.4 ratio      Assessment & Plan:  Problem List Items Addressed This Visit    None    Visit Diagnoses    Cellulitis of foot, left    -  Primary   Doxycycline sent, continue  cleaning twice daily and apply bactroban afterward. Strict return precautions given if worsening or not improving       Follow up plan: Return if symptoms worsen or fail to improve.

## 2018-08-23 ENCOUNTER — Ambulatory Visit (INDEPENDENT_AMBULATORY_CARE_PROVIDER_SITE_OTHER): Payer: PPO | Admitting: Family Medicine

## 2018-08-23 ENCOUNTER — Other Ambulatory Visit: Payer: Self-pay | Admitting: Family Medicine

## 2018-08-23 ENCOUNTER — Other Ambulatory Visit: Payer: Self-pay

## 2018-08-23 VITALS — BP 115/54 | HR 66 | Temp 98.5°F

## 2018-08-23 DIAGNOSIS — L03116 Cellulitis of left lower limb: Secondary | ICD-10-CM

## 2018-08-23 MED ORDER — SULFAMETHOXAZOLE-TRIMETHOPRIM 800-160 MG PO TABS: 1.0000 | ORAL_TABLET | Freq: Two times a day (BID) | ORAL | 0 refills | Status: DC

## 2018-08-23 NOTE — Progress Notes (Signed)
BP (!) 115/54   Pulse 66   Temp 98.5 F (36.9 C) (Oral)   SpO2 100%    Subjective:    Patient ID: Cassandra [name], female    DOB: [birth year], 78 y.o.   MRN: [id number]  HPI: Cassandra [name] is a 78 y.o. female  Chief Complaint  Patient presents with  . Cellulitis    Left foot. Not any better   Patient following up for left foot pain, swelling, and wound after a sharp limb came down on it 2-3 weeks ago. Completed course of doxycycline with mild improvement, but still having drainage, redness, swelling and pain. Has been doing foot soaks, elevating, applying neosporin daily. Denies fevers, chills, sweats.   Relevant past medical, surgical, family and social history reviewed and updated as indicated. Interim medical history since our last visit reviewed. Allergies and medications reviewed and updated.  Review of Systems  Per HPI unless specifically indicated above     Objective:    BP (!) 115/54   Pulse 66   Temp 98.5 F (36.9 C) (Oral)   SpO2 100%   Wt Readings from Last 3 Encounters:  06/10/18 114 lb (51.7 kg)  05/10/18 113 lb (51.3 kg)  06/10/17 118 lb (53.5 kg)    Physical Exam Vitals signs and nursing note reviewed.  Constitutional:      Appearance: Normal appearance. She is not ill-appearing.  HENT:     Head: Atraumatic.  Eyes:     Extraocular Movements: Extraocular movements intact.     Conjunctiva/sclera: Conjunctivae normal.  Neck:     Musculoskeletal: Normal range of motion and neck supple.  Cardiovascular:     Rate and Rhythm: Normal rate and regular rhythm.     Pulses: Normal pulses.     Heart sounds: Normal heart sounds.  Pulmonary:     Effort: Pulmonary effort is normal.     Breath sounds: Normal breath sounds.  Musculoskeletal: Normal range of motion.  Skin:    General: Skin is warm.     Findings: Erythema (top of left foot erythematous, edematous and ttp over wound. no active drainage but some dried drainage present) present.   Neurological:     Mental Status: She is alert and oriented to person, place, and time.     Sensory: No sensory deficit.     Motor: No weakness.  Psychiatric:        Mood and Affect: Mood normal.        Thought Content: Thought content normal.        Judgment: Judgment normal.     Results for orders placed or performed in visit on 05/10/18  Magnesium  Result Value Ref Range   Magnesium 2.4 (H) 1.6 - 2.3 mg/dL  Thyroid Panel With TSH  Result Value Ref Range   TSH 1.810 0.450 - 4.500 uIU/mL   T4, Total 7.4 4.5 - 12.0 ug/dL   T3 Uptake Ratio 21 (L) 24 - 39 %   Free Thyroxine Index 1.6 1.2 - 4.9  Comp Met (CMET)  Result Value Ref Range   Glucose 79 65 - 99 mg/dL   BUN 25 8 - 27 mg/dL   Creatinine, Ser 0.78 0.57 - 1.00 mg/dL   GFR calc non Af Amer 75 >59 mL/min/1.73   GFR calc Af Amer 86 >59 mL/min/1.73   BUN/Creatinine Ratio 32 (H) 12 - 28   Sodium 141 134 - 144 mmol/L   Potassium 4.8 3.5 - 5.2 mmol/L   Chloride 102  96 - 106 mmol/L   CO2 26 20 - 29 mmol/L   Calcium 10.4 (H) 8.7 - 10.3 mg/dL   Total Protein 6.5 6.0 - 8.5 g/dL   Albumin 4.9 (H) 3.7 - 4.7 g/dL   Globulin, Total 1.6 1.5 - 4.5 g/dL   Albumin/Globulin Ratio 3.1 (H) 1.2 - 2.2   Bilirubin Total 0.3 0.0 - 1.2 mg/dL   Alkaline Phosphatase 66 39 - 117 IU/L   AST 25 0 - 40 IU/L   ALT 17 0 - 32 IU/L  CBC w/Diff  Result Value Ref Range   WBC 4.9 3.4 - 10.8 x10E3/uL   RBC 5.21 3.77 - 5.28 x10E6/uL   Hemoglobin 14.0 11.1 - 15.9 g/dL   MCV 84 79 - 97 fL   MCH 26.9 26.6 - 33.0 pg   MCHC 31.9 31.5 - 35.7 g/dL   RDW 14.6 11.7 - 15.4 %   Platelets 283 150 - 450 x10E3/uL   Neutrophils 59 Not Estab. %   Lymphs 32 Not Estab. %   Monocytes 6 Not Estab. %   Eos 2 Not Estab. %   Basos 1 Not Estab. %   Neutrophils Absolute 2.8 1.4 - 7.0 x10E3/uL   Lymphocytes Absolute 1.6 0.7 - 3.1 x10E3/uL   Monocytes Absolute 0.3 0.1 - 0.9 x10E3/uL   EOS (ABSOLUTE) 0.1 0.0 - 0.4 x10E3/uL   Basophils Absolute 0.0 0.0 - 0.2 x10E3/uL    Immature Granulocytes 0 Not Estab. %   Immature Grans (Abs) 0.0 0.0 - 0.1 x10E3/uL  Anemia panel  Result Value Ref Range   Total Iron Binding Capacity 369 250 - 450 ug/dL   UIBC 310 118 - 369 ug/dL   Iron 59 27 - 139 ug/dL   Iron Saturation 16 15 - 55 %   Vitamin B-12 416 232 - 1,245 pg/mL   Folate, Hemolysate 398.0 Not Estab. ng/mL   Hematocrit 43.9 34.0 - 46.6 %   Folate, RBC 907 >498 ng/mL   Ferritin 46 15 - 150 ng/mL   Retic Ct Pct 0.6 0.6 - 2.6 %  Lipid Profile  Result Value Ref Range   Cholesterol, Total 212 (H) 100 - 199 mg/dL   Triglycerides 67 0 - 149 mg/dL   HDL 88 >39 mg/dL   VLDL Cholesterol Cal 13 5 - 40 mg/dL   LDL Calculated 111 (H) 0 - 99 mg/dL   Chol/HDL Ratio 2.4 0.0 - 4.4 ratio      Assessment & Plan:   Problem List Items Addressed This Visit    None    Visit Diagnoses    Cellulitis of foot, left    -  Primary   Switch to bactrim, obtain foot x-ray to assess for bony injury or foreign fragments. Continue soaks, warm compresses, topical antibiotic, elevation   Relevant Orders   DG Foot Complete Left (Completed)       Follow up plan: Return in about 1 week (around 08/30/2018) for Wound check.

## 2018-08-24 ENCOUNTER — Ambulatory Visit
Admission: RE | Admit: 2018-08-24 | Discharge: 2018-08-24 | Disposition: A | Discharge status: Home / Self Care | Payer: PPO | Source: Ambulatory Visit | Attending: Family Medicine | Admitting: Family Medicine

## 2018-08-24 DIAGNOSIS — S99922A Unspecified injury of left foot, initial encounter: Secondary | ICD-10-CM | POA: Diagnosis not present

## 2018-08-24 DIAGNOSIS — L03116 Cellulitis of left lower limb: Secondary | ICD-10-CM

## 2018-08-24 DIAGNOSIS — M7989 Other specified soft tissue disorders: Secondary | ICD-10-CM | POA: Diagnosis not present

## 2018-08-24 DIAGNOSIS — M79672 Pain in left foot: Secondary | ICD-10-CM | POA: Diagnosis not present

## 2018-08-30 ENCOUNTER — Ambulatory Visit (INDEPENDENT_AMBULATORY_CARE_PROVIDER_SITE_OTHER): Payer: PPO | Admitting: Nurse Practitioner

## 2018-08-30 ENCOUNTER — Encounter: Payer: Self-pay | Admitting: Nurse Practitioner

## 2018-08-30 ENCOUNTER — Other Ambulatory Visit: Payer: Self-pay

## 2018-08-30 DIAGNOSIS — S91302A Unspecified open wound, left foot, initial encounter: Secondary | ICD-10-CM | POA: Diagnosis not present

## 2018-08-30 NOTE — Patient Instructions (Signed)
Wound Care, Adult  Taking care of your wound properly can help to prevent pain, infection, and scarring. It can also help your wound to heal more quickly.  How to care for your wound  Wound care          Follow instructions from your health care provider about how to take care of your wound. Make sure you:  ? Wash your hands with soap and water before you change the bandage (dressing). If soap and water are not available, use hand sanitizer.  ? Change your dressing as told by your health care provider.  ? Leave stitches (sutures), skin glue, or adhesive strips in place. These skin closures may need to stay in place for 2 weeks or longer. If adhesive strip edges start to loosen and curl up, you may trim the loose edges. Do not remove adhesive strips completely unless your health care provider tells you to do that.   Check your wound area every day for signs of infection. Check for:  ? Redness, swelling, or pain.  ? Fluid or blood.  ? Warmth.  ? Pus or a bad smell.   Ask your health care provider if you should clean the wound with mild soap and water. Doing this may include:  ? Using a clean towel to pat the wound dry after cleaning it. Do not rub or scrub the wound.  ? Applying a cream or ointment. Do this only as told by your health care provider.  ? Covering the incision with a clean dressing.   Ask your health care provider when you can leave the wound uncovered.   Keep the dressing dry until your health care provider says it can be removed. Do not take baths, swim, use a hot tub, or do anything that would put the wound underwater until your health care provider approves. Ask your health care provider if you can take showers. You may only be allowed to take sponge baths.  Medicines     If you were prescribed an antibiotic medicine, cream, or ointment, take or use the antibiotic as told by your health care provider. Do not stop taking or using the antibiotic even if your condition improves.   Take  over-the-counter and prescription medicines only as told by your health care provider. If you were prescribed pain medicine, take it 30 or more minutes before you do any wound care or as told by your health care provider.  General instructions   Return to your normal activities as told by your health care provider. Ask your health care provider what activities are safe.   Do not scratch or pick at the wound.   Do not use any products that contain nicotine or tobacco, such as cigarettes and e-cigarettes. These may delay wound healing. If you need help quitting, ask your health care provider.   Keep all follow-up visits as told by your health care provider. This is important.   Eat a diet that includes protein, vitamin A, vitamin C, and other nutrient-rich foods to help the wound heal.  ? Foods rich in protein include meat, dairy, beans, nuts, and other sources.  ? Foods rich in vitamin A include carrots and dark green, leafy vegetables.  ? Foods rich in vitamin C include citrus, tomatoes, and other fruits and vegetables.  ? Nutrient-rich foods have protein, carbohydrates, fat, vitamins, or minerals. Eat a variety of healthy foods including vegetables, fruits, and whole grains.  Contact a health care provider if:     You received a tetanus shot and you have swelling, severe pain, redness, or bleeding at the injection site.   Your pain is not controlled with medicine.   You have redness, swelling, or pain around the wound.   You have fluid or blood coming from the wound.   Your wound feels warm to the touch.   You have pus or a bad smell coming from the wound.   You have a fever or chills.   You are nauseous or you vomit.   You are dizzy.  Get help right away if:   You have a red streak going away from your wound.   The edges of the wound open up and separate.   Your wound is bleeding, and the bleeding does not stop with gentle pressure.   You have a rash.   You faint.   You have trouble  breathing.  Summary   Always wash your hands with soap and water before changing your bandage (dressing).   To help with healing, eat foods that are rich in protein, vitamin A, vitamin C, and other nutrients.   Check your wound every day for signs of infection. Contact your health care provider if you suspect that your wound is infected.  This information is not intended to replace advice given to you by your health care provider. Make sure you discuss any questions you have with your health care provider.  Document Released: 01/08/2008 Document Revised: 05/12/2017 Document Reviewed: 10/16/2015  [employer]vier Interactive Patient Education  2019 [employer]

## 2018-08-30 NOTE — Assessment & Plan Note (Signed)
Acute and improving with two rounds of abx treatment.  Continue Bactrim until complete + continue daily wound care and application of Mupirocin. Monitor area daily for s/s infection and notify provider if present + monitor temperature daily.  If worsening or continued symptoms return to provider immediately.

## 2018-08-30 NOTE — Progress Notes (Signed)
There were no vitals taken for this visit.   Subjective:    Patient ID: Cassandra [name], female    DOB: [birth year], 78 y.o.   MRN: [id number]  HPI: Cassandra [name] is a 78 y.o. female  Chief Complaint  Patient presents with  . Foot Pain    left    . This visit was completed via WebEx due to the restrictions of the COVID-19 pandemic. All issues as above were discussed and addressed. Physical exam was done as above through visual confirmation on WebEx. If it was felt that the patient should be evaluated in the office, they were directed there. The patient verbally consented to this visit. . Location of the patient: home . Location of the provider: home . Those involved with this call:  . Provider: Marnee Guarneri, DNP . CMA: Merilyn Baba, CMA . Front Desk/Registration: Jill Side  . Time spent on call: 15 minutes with patient face to face via video conference. More than 50% of this time was spent in counseling and coordination of care. 10 minutes total spent in review of patient's record and preparation of their chart. I verified patient identity using two factors (patient name and date of birth). Patient consents verbally to being seen via telemedicine visit today.   LEFT FOOT PAIN Initial visit 08/09/2018 for foot pain after puncture injury from tree limb onto her foot.  She was initially treated with Doxycycline and then at return visit on 08/23/2018 was switched to Bactrim.  Imaging obtained 08/24/2018 noting mild hallux valgus deformity with no fracture or dislocation.  No foreign body.  There was soft tissue swelling noted dorsally.  States it is "a lot better".  Still getting swollen at night occasionally and has occasional intermittent scant yellow drainage.  Takes her last Bactrim today, she reports having some stomach upset with this and would prefer break from abx.  Continues to use Mupirocin to wound and cleans area, reports it is improving. Duration: months Involved foot:  left Mechanism of injury: trauma Location: left mid dorsal aspect Onset: sudden  Severity: none Status: better Treatments attempted: oral abx, Mupirocin, and rest Morning stiffness: no Swelling: no Redness: no Bruising: no Paresthesias / decreased sensation: no  Fevers:no  Relevant past medical, surgical, family and social history reviewed and updated as indicated. Interim medical history since our last visit reviewed. Allergies and medications reviewed and updated.  Review of Systems  Constitutional: Negative for activity change, appetite change, diaphoresis, fatigue and fever.  Respiratory: Negative for cough, chest tightness and shortness of breath.   Cardiovascular: Negative for chest pain, palpitations and leg swelling.  Gastrointestinal: Negative for abdominal distention, abdominal pain, constipation, diarrhea, nausea and vomiting.  Skin: Positive for wound.  Psychiatric/Behavioral: Negative.     Per HPI unless specifically indicated above     Objective:    There were no vitals taken for this visit.  Wt Readings from Last 3 Encounters:  06/10/18 114 lb (51.7 kg)  05/10/18 113 lb (51.3 kg)  06/10/17 118 lb (53.5 kg)    Physical Exam Vitals signs and nursing note reviewed.  Constitutional:      General: She is awake. She is not in acute distress.    Appearance: She is well-developed. She is not ill-appearing.  HENT:     Head: Normocephalic.     Right Ear: Hearing normal.     Left Ear: Hearing normal.  Eyes:     General: Lids are normal.  Right eye: No discharge.        Left eye: No discharge.     Conjunctiva/sclera: Conjunctivae normal.  Neck:     Musculoskeletal: Normal range of motion.  Cardiovascular:     Comments: Unable to auscultate due to virtual exam only Pulmonary:     Effort: Pulmonary effort is normal. No accessory muscle usage or respiratory distress.     Comments: Unable to auscultate due to virtual exam only Skin:    General: Skin is  warm and dry.     Findings: Wound present.     Comments: Virtual exam with patient assistance: Reports no warmth or edema to site.  On visual exam no edema noted.  Very mild erythema around puncture wound site.  Puncture wound to left mid dorsal aspect of foot with yellow crusting to site, no drainage at this time.  Full ROM present of bilateral feet without discomfort reported.  Neurological:     Mental Status: She is alert and oriented to person, place, and time.  Psychiatric:        Attention and Perception: Attention normal.        Mood and Affect: Mood normal.        Behavior: Behavior normal. Behavior is cooperative.        Thought Content: Thought content normal.        Judgment: Judgment normal.     Results for orders placed or performed in visit on 05/10/18  Magnesium  Result Value Ref Range   Magnesium 2.4 (H) 1.6 - 2.3 mg/dL  Thyroid Panel With TSH  Result Value Ref Range   TSH 1.810 0.450 - 4.500 uIU/mL   T4, Total 7.4 4.5 - 12.0 ug/dL   T3 Uptake Ratio 21 (L) 24 - 39 %   Free Thyroxine Index 1.6 1.2 - 4.9  Comp Met (CMET)  Result Value Ref Range   Glucose 79 65 - 99 mg/dL   BUN 25 8 - 27 mg/dL   Creatinine, Ser 0.78 0.57 - 1.00 mg/dL   GFR calc non Af Amer 75 >59 mL/min/1.73   GFR calc Af Amer 86 >59 mL/min/1.73   BUN/Creatinine Ratio 32 (H) 12 - 28   Sodium 141 134 - 144 mmol/L   Potassium 4.8 3.5 - 5.2 mmol/L   Chloride 102 96 - 106 mmol/L   CO2 26 20 - 29 mmol/L   Calcium 10.4 (H) 8.7 - 10.3 mg/dL   Total Protein 6.5 6.0 - 8.5 g/dL   Albumin 4.9 (H) 3.7 - 4.7 g/dL   Globulin, Total 1.6 1.5 - 4.5 g/dL   Albumin/Globulin Ratio 3.1 (H) 1.2 - 2.2   Bilirubin Total 0.3 0.0 - 1.2 mg/dL   Alkaline Phosphatase 66 39 - 117 IU/L   AST 25 0 - 40 IU/L   ALT 17 0 - 32 IU/L  CBC w/Diff  Result Value Ref Range   WBC 4.9 3.4 - 10.8 x10E3/uL   RBC 5.21 3.77 - 5.28 x10E6/uL   Hemoglobin 14.0 11.1 - 15.9 g/dL   MCV 84 79 - 97 fL   MCH 26.9 26.6 - 33.0 pg   MCHC 31.9  31.5 - 35.7 g/dL   RDW 14.6 11.7 - 15.4 %   Platelets 283 150 - 450 x10E3/uL   Neutrophils 59 Not Estab. %   Lymphs 32 Not Estab. %   Monocytes 6 Not Estab. %   Eos 2 Not Estab. %   Basos 1 Not Estab. %   Neutrophils Absolute 2.8  1.4 - 7.0 x10E3/uL   Lymphocytes Absolute 1.6 0.7 - 3.1 x10E3/uL   Monocytes Absolute 0.3 0.1 - 0.9 x10E3/uL   EOS (ABSOLUTE) 0.1 0.0 - 0.4 x10E3/uL   Basophils Absolute 0.0 0.0 - 0.2 x10E3/uL   Immature Granulocytes 0 Not Estab. %   Immature Grans (Abs) 0.0 0.0 - 0.1 x10E3/uL  Anemia panel  Result Value Ref Range   Total Iron Binding Capacity 369 250 - 450 ug/dL   UIBC 310 118 - 369 ug/dL   Iron 59 27 - 139 ug/dL   Iron Saturation 16 15 - 55 %   Vitamin B-12 416 232 - 1,245 pg/mL   Folate, Hemolysate 398.0 Not Estab. ng/mL   Hematocrit 43.9 34.0 - 46.6 %   Folate, RBC 907 >498 ng/mL   Ferritin 46 15 - 150 ng/mL   Retic Ct Pct 0.6 0.6 - 2.6 %  Lipid Profile  Result Value Ref Range   Cholesterol, Total 212 (H) 100 - 199 mg/dL   Triglycerides 67 0 - 149 mg/dL   HDL 88 >39 mg/dL   VLDL Cholesterol Cal 13 5 - 40 mg/dL   LDL Calculated 111 (H) 0 - 99 mg/dL   Chol/HDL Ratio 2.4 0.0 - 4.4 ratio      Assessment & Plan:   Problem List Items Addressed This Visit      Other   Wound of left foot    Acute and improving with two rounds of abx treatment.  Continue Bactrim until complete + continue daily wound care and application of Mupirocin. Monitor area daily for s/s infection and notify provider if present + monitor temperature daily.  If worsening or continued symptoms return to provider immediately.         I discussed the assessment and treatment plan with the patient. The patient was provided an opportunity to ask questions and all were answered. The patient agreed with the plan and demonstrated an understanding of the instructions.   The patient was advised to call back or seek an in-person evaluation if the symptoms worsen or if the condition  fails to improve as anticipated.   I provided 15 minutes of time during this encounter.  Follow up plan: Return if symptoms worsen or fail to improve.

## 2018-09-07 ENCOUNTER — Other Ambulatory Visit: Payer: Self-pay | Admitting: Nurse Practitioner

## 2018-09-07 ENCOUNTER — Telehealth: Payer: Self-pay | Admitting: Nurse Practitioner

## 2018-09-07 MED ORDER — SULFAMETHOXAZOLE-TRIMETHOPRIM 800-160 MG PO TABS: 1.0000 | ORAL_TABLET | Freq: Two times a day (BID) | ORAL | 0 refills | Status: DC

## 2018-09-07 NOTE — Telephone Encounter (Signed)
Copied from [place] [phone]. Topic: Quick Communication - See Telephone Encounter >> Sep 07, 2018  8:45 AM Valla Leaver wrote: CRM for notification. See Telephone encounter for: 09/07/18. Patient requesting more antibiotics because her foot is still a concern. Would like a call back to discuss.

## 2018-09-07 NOTE — Telephone Encounter (Signed)
Spoke to patient via telephone, she had taken break off Bactrim due to GI discomfort taking it so closely to Doxycyline.  She would like to start on it again and have refill sent in.  States her foot is getting better, no drainage, but is still "not 100%".  Reports discomfort more at night, not during day time.  Denies redness.  States it does swell occasionally at night.  Discussed with her would send in refill, but would like to see her face to face next week to assess foot.  She agrees with plan of care.  Agrees to be seen sooner if worsening symptoms.

## 2018-09-09 DIAGNOSIS — M79605 Pain in left leg: Secondary | ICD-10-CM | POA: Diagnosis not present

## 2018-09-09 DIAGNOSIS — M79604 Pain in right leg: Secondary | ICD-10-CM | POA: Diagnosis not present

## 2018-09-09 DIAGNOSIS — R252 Cramp and spasm: Secondary | ICD-10-CM | POA: Diagnosis not present

## 2018-09-09 DIAGNOSIS — Z7189 Other specified counseling: Secondary | ICD-10-CM | POA: Diagnosis not present

## 2018-09-14 ENCOUNTER — Ambulatory Visit (INDEPENDENT_AMBULATORY_CARE_PROVIDER_SITE_OTHER): Payer: PPO | Admitting: Nurse Practitioner

## 2018-09-14 ENCOUNTER — Other Ambulatory Visit: Payer: Self-pay

## 2018-09-14 ENCOUNTER — Ambulatory Visit: Payer: PPO | Admitting: Nurse Practitioner

## 2018-09-14 ENCOUNTER — Encounter: Payer: Self-pay | Admitting: Nurse Practitioner

## 2018-09-14 ENCOUNTER — Telehealth: Payer: Self-pay | Admitting: Nurse Practitioner

## 2018-09-14 VITALS — BP 129/83 | HR 65 | Temp 98.5°F | Wt 113.0 lb

## 2018-09-14 DIAGNOSIS — Z23 Encounter for immunization: Secondary | ICD-10-CM | POA: Diagnosis not present

## 2018-09-14 DIAGNOSIS — S91302A Unspecified open wound, left foot, initial encounter: Secondary | ICD-10-CM | POA: Diagnosis not present

## 2018-09-14 MED ORDER — CEPHALEXIN 500 MG PO CAPS: 500.0000 mg | ORAL_CAPSULE | Freq: Four times a day (QID) | ORAL | 0 refills | Status: AC

## 2018-09-14 MED ORDER — NYSTATIN 100000 UNIT/ML MT SUSP: 5.0000 mL | Freq: Three times a day (TID) | OROMUCOSAL | 0 refills | Status: DC

## 2018-09-14 NOTE — Progress Notes (Signed)
BP 129/83   Pulse 65   Temp 98.5 F (36.9 C) (Oral)   Wt 113 lb (51.3 kg)   SpO2 97%   BMI 19.04 kg/m    Subjective:    Patient ID: Cassandra [name], female    DOB: [birth year], 78 y.o.   MRN: [id number]  HPI: Cassandra [name] is a 78 y.o. female  Chief Complaint  Patient presents with  . Follow-up  . Foot wound  . Advice Only    Pt concerned by news from neurologist   FOOT WOUND: Initial visit 08/09/2018 for foot pain after puncture injury from tree limb onto her foot.  Initially treated with Doxycycline and then at return visit on 08/23/2018 was switched to Bactrim x 2.  Imaging obtained 08/24/2018 noting mild hallux valgus deformity with no fracture or dislocation.  No foreign body. There was soft tissue swelling noted dorsally.  States it did get a little better, but then started swelling again at site and draining yellow drainage.  Notices swelling more at night.  Continues to use Mupirocin to wound and cleans area twice a day.  Will obtain Td today.  States that with previous abx treatment she did get sore mouth, which resolved on own. Duration: months Involved foot: left Mechanism of injury: trauma Location: left mid dorsal aspect Onset: sudden  Severity: none Status: better Treatments attempted: oral abx, Mupirocin, and rest Morning stiffness: no Swelling: no Redness: no Bruising: no Paresthesias / decreased sensation: no  Fevers:no  ADVICE: Reviewed neurology notes with patient, appears they will be ordering MRI of brain.  Discussed with patient and she is going to call neuro office and review order.  Relevant past medical, surgical, family and social history reviewed and updated as indicated. Interim medical history since our last visit reviewed. Allergies and medications reviewed and updated.  Review of Systems  Constitutional: Negative for activity change, appetite change, diaphoresis, fatigue and fever.  Respiratory: Negative for cough, chest tightness and  shortness of breath.   Cardiovascular: Negative for chest pain, palpitations and leg swelling.  Gastrointestinal: Negative for abdominal distention, abdominal pain, constipation, diarrhea, nausea and vomiting.  Endocrine: Negative for cold intolerance, heat intolerance, polydipsia, polyphagia and polyuria.  Skin: Positive for wound.  Neurological: Negative for dizziness, syncope, weakness, light-headedness, numbness and headaches.  Psychiatric/Behavioral: Negative.     Per HPI unless specifically indicated above     Objective:    BP 129/83   Pulse 65   Temp 98.5 F (36.9 C) (Oral)   Wt 113 lb (51.3 kg)   SpO2 97%   BMI 19.04 kg/m   Wt Readings from Last 3 Encounters:  09/14/18 113 lb (51.3 kg)  06/10/18 114 lb (51.7 kg)  05/10/18 113 lb (51.3 kg)    Physical Exam Vitals signs and nursing note reviewed.  Constitutional:      General: She is awake. She is not in acute distress.    Appearance: She is well-developed. She is not ill-appearing.  HENT:     Head: Normocephalic.     Right Ear: Hearing normal.     Left Ear: Hearing normal.     Nose: Nose normal.     Mouth/Throat:     Mouth: Mucous membranes are moist.  Eyes:     General: Lids are normal.        Right eye: No discharge.        Left eye: No discharge.     Conjunctiva/sclera: Conjunctivae normal.     Pupils:  Pupils are equal, round, and reactive to light.  Neck:     Musculoskeletal: Normal range of motion and neck supple.  Cardiovascular:     Rate and Rhythm: Normal rate and regular rhythm.     Heart sounds: Normal heart sounds. No murmur. No gallop.   Pulmonary:     Effort: Pulmonary effort is normal.     Breath sounds: Normal breath sounds.  Abdominal:     General: Bowel sounds are normal.     Palpations: Abdomen is soft.  Musculoskeletal:     Right lower leg: No edema.     Left lower leg: No edema.  Skin:    General: Skin is warm and dry.     Findings: Wound present.     Comments: Mild erythema  around puncture wound site on dorsal aspect mid left foot with slightly raised area around wound which is soft and tender to touch, no warmth.  Puncture wound with crusting to site, no drainage at this time.  Full ROM present of bilateral feet without discomfort reported.   Neurological:     Mental Status: She is alert and oriented to person, place, and time.  Psychiatric:        Attention and Perception: Attention normal.        Mood and Affect: Mood normal.        Behavior: Behavior normal. Behavior is cooperative.        Thought Content: Thought content normal.        Judgment: Judgment normal.     Results for orders placed or performed in visit on 05/10/18  Magnesium  Result Value Ref Range   Magnesium 2.4 (H) 1.6 - 2.3 mg/dL  Thyroid Panel With TSH  Result Value Ref Range   TSH 1.810 0.450 - 4.500 uIU/mL   T4, Total 7.4 4.5 - 12.0 ug/dL   T3 Uptake Ratio 21 (L) 24 - 39 %   Free Thyroxine Index 1.6 1.2 - 4.9  Comp Met (CMET)  Result Value Ref Range   Glucose 79 65 - 99 mg/dL   BUN 25 8 - 27 mg/dL   Creatinine, Ser 0.78 0.57 - 1.00 mg/dL   GFR calc non Af Amer 75 >59 mL/min/1.73   GFR calc Af Amer 86 >59 mL/min/1.73   BUN/Creatinine Ratio 32 (H) 12 - 28   Sodium 141 134 - 144 mmol/L   Potassium 4.8 3.5 - 5.2 mmol/L   Chloride 102 96 - 106 mmol/L   CO2 26 20 - 29 mmol/L   Calcium 10.4 (H) 8.7 - 10.3 mg/dL   Total Protein 6.5 6.0 - 8.5 g/dL   Albumin 4.9 (H) 3.7 - 4.7 g/dL   Globulin, Total 1.6 1.5 - 4.5 g/dL   Albumin/Globulin Ratio 3.1 (H) 1.2 - 2.2   Bilirubin Total 0.3 0.0 - 1.2 mg/dL   Alkaline Phosphatase 66 39 - 117 IU/L   AST 25 0 - 40 IU/L   ALT 17 0 - 32 IU/L  CBC w/Diff  Result Value Ref Range   WBC 4.9 3.4 - 10.8 x10E3/uL   RBC 5.21 3.77 - 5.28 x10E6/uL   Hemoglobin 14.0 11.1 - 15.9 g/dL   MCV 84 79 - 97 fL   MCH 26.9 26.6 - 33.0 pg   MCHC 31.9 31.5 - 35.7 g/dL   RDW 14.6 11.7 - 15.4 %   Platelets 283 150 - 450 x10E3/uL   Neutrophils 59 Not Estab. %    Lymphs 32 Not Estab. %  Monocytes 6 Not Estab. %   Eos 2 Not Estab. %   Basos 1 Not Estab. %   Neutrophils Absolute 2.8 1.4 - 7.0 x10E3/uL   Lymphocytes Absolute 1.6 0.7 - 3.1 x10E3/uL   Monocytes Absolute 0.3 0.1 - 0.9 x10E3/uL   EOS (ABSOLUTE) 0.1 0.0 - 0.4 x10E3/uL   Basophils Absolute 0.0 0.0 - 0.2 x10E3/uL   Immature Granulocytes 0 Not Estab. %   Immature Grans (Abs) 0.0 0.0 - 0.1 x10E3/uL  Anemia panel  Result Value Ref Range   Total Iron Binding Capacity 369 250 - 450 ug/dL   UIBC 310 118 - 369 ug/dL   Iron 59 27 - 139 ug/dL   Iron Saturation 16 15 - 55 %   Vitamin B-12 416 232 - 1,245 pg/mL   Folate, Hemolysate 398.0 Not Estab. ng/mL   Hematocrit 43.9 34.0 - 46.6 %   Folate, RBC 907 >498 ng/mL   Ferritin 46 15 - 150 ng/mL   Retic Ct Pct 0.6 0.6 - 2.6 %  Lipid Profile  Result Value Ref Range   Cholesterol, Total 212 (H) 100 - 199 mg/dL   Triglycerides 67 0 - 149 mg/dL   HDL 88 >39 mg/dL   VLDL Cholesterol Cal 13 5 - 40 mg/dL   LDL Calculated 111 (H) 0 - 99 mg/dL   Chol/HDL Ratio 2.4 0.0 - 4.4 ratio      Assessment & Plan:   Problem List Items Addressed This Visit      Other   Wound of left foot - Primary    Acute, with return of erythema and edema to site.  Has been treated with Bactrim and Doxycycline.  Script sent for Keflex and sent in Nystatin if needed for thrush of mouth.  Urgent referral to podiatry for further assessment and possible I&D of site due to ongoing issues.  Td vaccine provided today. Continue Mupirocin and monitoring of temperature at home.  Tylenol as needed for discomfort and may apply ice to area.  Return in one week.      Relevant Orders   Ambulatory referral to Podiatry       Follow up plan: Return in about 1 week (around 09/21/2018) for Follow-up puncture wound.

## 2018-09-14 NOTE — Patient Instructions (Addendum)
Diphtheria Toxoid; Tetanus Toxoid Adsorbed, DT, Td What is this medicine? DIPHTHERIA AND TETANUS TOXOIDS ADSORBED (dif THEER ee uh and TET n [place] TOK soids ad SAWRB) is a vaccine. It is used to prevent infections of diphtheria and tetanus (lockjaw). This medicine may be used for other purposes; ask your health care provider or pharmacist if you have questions. COMMON BRAND NAME(S): DECAVAC, TDVAX, TENIVAC What should I tell my health care provider before I take this medicine? They need to know if you have any of these conditions: -bleeding disorder -immune system problems -infection with fever -low levels of platelets in the blood -an unusual or allergic reaction to diphtheria or tetanus toxoid, latex, thimerosal, other medicines, foods, dyes, or preservatives -pregnant or trying to get pregnant -breast-feeding How should I use this medicine? This vaccine is for injection into a muscle. It is given by a health care professional. A copy of Vaccine Information Statements will be given before each vaccination. Read this sheet carefully each time. The sheet may change frequently. Talk to your pediatrician regarding the use of this medicine in children. While this drug may be prescribed for selected conditions, precautions do apply. Overdosage: If you think you have taken too much of this medicine contact a poison control center or emergency room at once. NOTE: This medicine is only for you. Do not share this medicine with others. What if I miss a dose? Keep appointments for follow-up (booster) doses as directed. It is important not to miss your dose. Call your doctor or health care professional if you are unable to keep an appointment. What may interact with this medicine? -adalimumab -anakinra -infliximab -live vaccines -medicines that suppress your immune system -medicines to treat cancer -medicines that treat or prevent blood clots like daily aspirin, enoxaparin, heparin, ticlopidine,  warfarin -radiopharmaceuticals like iodine I-125 or I-131 This list may not describe all possible interactions. Give your health care provider a list of all the medicines, herbs, non-prescription drugs, or dietary supplements you use. Also tell them if you smoke, drink alcohol, or use illegal drugs. Some items may interact with your medicine. What should I watch for while using this medicine? Contact your doctor or health care professional and seek emergency medical care if any serious side effects occur. This vaccine, like all vaccines, may not fully protect everyone. What side effects may I notice from receiving this medicine? Side effects that you should report to your doctor or health care professional as soon as possible: -allergic reactions like skin rash, itching or hives, swelling of the face, lips, or tongue -arthritis pain -breathing problems -changes in hearing -extreme changes in behavior -fast, irregular heartbeat -fever over 100 degrees F -pain, tingling, numbness in the hands or feet -seizures -unusually weak or tired Side effects that usually do not require medical attention (report to your doctor or health care professional if they continue or are bothersome): -aches or pains -bruising, pain, swelling at site where injected -headache -loss of appetite -low-grade fever of 100 degrees F or less -nausea, vomiting -sleepy -swollen glands This list may not describe all possible side effects. Call your doctor for medical advice about side effects. You may report side effects to FDA at 1-800-FDA-1088. Where should I keep my medicine? This drug is given in a hospital or clinic and will not be stored at home. NOTE: This sheet is a summary. It may not cover all possible information. If you have questions about this medicine, talk to your doctor, pharmacist, or health care provider.  2019 Elsevier/Gold Standard (2007-07-29 13:57:36) Td Vaccine (Tetanus and Diphtheria): What You  Need to Know 1. Why get vaccinated? Tetanus  and diphtheria are very serious diseases. They are rare in the [place] today, but people who do become infected often have severe complications. Td vaccine is used to protect adolescents and adults from both of these diseases. Both tetanus and diphtheria are infections caused by bacteria. Diphtheria spreads from person to person through coughing or sneezing. Tetanus-causing bacteria enter the body through cuts, scratches, or wounds. TETANUS (Lockjaw) causes painful muscle tightening and stiffness, usually all over the body.  It can lead to tightening of muscles in the head and neck so you can't open your mouth, swallow, or sometimes even breathe. Tetanus kills about 1 out of every 10 people who are infected even after receiving the best medical care. DIPHTHERIA can cause a thick coating to form in the back of the throat.  It can lead to breathing problems, paralysis, heart failure, and death. Before vaccines, as many as 200,000 cases of diphtheria and hundreds of cases of tetanus were reported in the [place] each year. Since vaccination began, reports of cases for both diseases have dropped by about 99%. 2. Td vaccine Td vaccine can protect adolescents and adults from tetanus and diphtheria. Td is usually given as a booster dose every 10 years but it can also be given earlier after a severe and dirty wound or burn. Another vaccine, called Tdap, which protects against pertussis in addition to tetanus and diphtheria, is sometimes recommended instead of Td vaccine. Your doctor or the person giving you the vaccine can give you more information. Td may safely be given at the same time as other vaccines. 3. Some people should not get this vaccine  A person who has ever had a life-threatening allergic reaction after a previous dose of any tetanus or diphtheria containing vaccine, OR has a severe allergy to any part of this vaccine, should not get Td  vaccine. Tell the person giving the vaccine about any severe allergies.  Talk to your doctor if you: ? had severe pain or swelling after any vaccine containing diphtheria or tetanus, ? ever had a condition called Guillain Barr Syndrome (GBS), ? aren't feeling well on the day the shot is scheduled. 4. Risks of a vaccine reaction With any medicine, including vaccines, there is a chance of side effects. These are usually mild and go away on their own. Serious reactions are also possible but are rare. Most people who get Td vaccine do not have any problems with it. Mild Problems following Td vaccine: (Did not interfere with activities)  Pain where the shot was given (about 8 people in 10)  Redness or swelling where the shot was given (about 1 person in 4)  Mild fever (rare)  Headache (about 1 person in 4)  Tiredness (about 1 person in 4) Moderate Problems following Td vaccine: (Interfered with activities, but did not require medical attention)  Fever over 102F (rare) Severe Problems following Td vaccine: (Unable to perform usual activities; required medical attention)  Swelling, severe pain, bleeding and/or redness in the arm where the shot was given (rare). Problems that could happen after any vaccine:  People sometimes faint after a medical procedure, including vaccination. Sitting or lying down for about 15 minutes can help prevent fainting, and injuries caused by a fall. Tell your doctor if you feel dizzy, or have vision changes or ringing in the ears.  Some people get severe  pain in the shoulder and have difficulty moving the arm where a shot was given. This happens very rarely.  Any medication can cause a severe allergic reaction. Such reactions from a vaccine are very rare, estimated at fewer than 1 in a million doses, and would happen within a few minutes to a few hours after the vaccination. As with any medicine, there is a very remote chance of a vaccine causing a serious  injury or death. The safety of vaccines is always being monitored. For more information, visit: http://www.aguilar.org/ 5. What if there is a serious reaction? What should I look for?  Look for anything that concerns you, such as signs of a severe allergic reaction, very high fever, or unusual behavior. Signs of a severe allergic reaction can include hives, swelling of the face and throat, difficulty breathing, a fast heartbeat, dizziness, and weakness. These would usually start a few minutes to a few hours after the vaccination. What should I do?  If you think it is a severe allergic reaction or other emergency that can't wait, call 9-1-1 or get the person to the nearest hospital. Otherwise, call your doctor.  Afterward, the reaction should be reported to the Vaccine Adverse Event Reporting System (VAERS). Your doctor might file this report, or you can do it yourself through the VAERS web site at www.vaers.SamedayNews.es, or by calling (947) 189-6653. VAERS does not give medical advice. 6. The National Vaccine Injury Compensation Program The [employer] Vaccine Injury Compensation Program (VICP) is a federal program that was created to compensate people who may have been injured by certain vaccines. Persons who believe they may have been injured by a vaccine can learn about the program and about filing a claim by calling (805)130-0926 or visiting the [employer] website at GoldCloset.com.ee. There is a time limit to file a claim for compensation. 7. How can I learn more?  Ask your doctor. He or she can give you the vaccine package insert or suggest other sources of information.  Call your local or state health department.  Contact the Centers for Disease Control and Prevention (CDC): ? Call 867-815-2270 (1-800-CDC-INFO) ? Visit CDC's website at http://hunter.com/ Vaccine Information Statement Td Vaccine (07/24/15) This information is not intended to replace advice given to you by  your health care provider. Make sure you discuss any questions you have with your health care provider. Document Released: 01/26/2006 Document Revised: 11/16/2017 Document Reviewed: 11/16/2017 Elsevier Interactive Patient Education  2019 [employer].

## 2018-09-14 NOTE — Telephone Encounter (Signed)
Copied from [place] [id number]. Topic: General - Other >> Sep 14, 2018 11:12 AM Oneta Rack wrote:  Relation to pt: self  Call back number:[phone] Pharmacy:  Reason for call:  Patient scheduled to see podiatrist tomorrow and would like to know if she should start antibiotics prescribed by PCP today ,please advise

## 2018-09-14 NOTE — Assessment & Plan Note (Signed)
Acute, with return of erythema and edema to site.  Has been treated with Bactrim and Doxycycline.  Script sent for Keflex and sent in Nystatin if needed for thrush of mouth.  Urgent referral to podiatry for further assessment and possible I&D of site due to ongoing issues.  Td vaccine provided today. Continue Mupirocin and monitoring of temperature at home.  Tylenol as needed for discomfort and may apply ice to area.  Return in one week.

## 2018-09-14 NOTE — Telephone Encounter (Signed)
Patient notified

## 2018-09-14 NOTE — Telephone Encounter (Signed)
Will alert patient to hold abx until sees podiatrist tomorrow in case they I&D area.

## 2018-09-15 ENCOUNTER — Telehealth: Payer: Self-pay | Admitting: Nurse Practitioner

## 2018-09-15 DIAGNOSIS — S90852A Superficial foreign body, left foot, initial encounter: Secondary | ICD-10-CM | POA: Diagnosis not present

## 2018-09-15 NOTE — Telephone Encounter (Signed)
Spoke to patient on phone for update on foot appointment.  She reports they did find a piece of wood still remaining in her foot and were able to get it out "without cutting too much".  She stated it is feeling better today.  Reports she cancelled appointment with PCP for next week, but kept foot doctor appointment and inquired if that was okay.  This provider reported that was okay.  She stated appreciation for call and for referral to podiatry.

## 2018-09-16 ENCOUNTER — Telehealth: Payer: Self-pay | Admitting: Nurse Practitioner

## 2018-09-16 NOTE — Telephone Encounter (Signed)
Copied from [place] [id number]. Topic: Quick Communication - Appointment Cancellation >> Sep 15, 2018  4:47 PM Erick Blinks wrote: Patient called to cancel appointment scheduled for 09/21/2018 Patient Has not rescheduled their appointment.  Pt will be seeing Dr. Jens Som about the wood in her foot, she will reschedule later   Route to department's PEC pool.

## 2018-09-16 NOTE — Telephone Encounter (Signed)
Aware, spoke to patient on phone yesterday evening for update on foot and agreed she could cancel this appointment and maintain appointment with podiatry for foot follow-up.

## 2018-09-21 ENCOUNTER — Ambulatory Visit: Payer: PPO | Admitting: Nurse Practitioner

## 2018-09-22 ENCOUNTER — Other Ambulatory Visit: Payer: Self-pay | Admitting: Neurology

## 2018-09-22 DIAGNOSIS — M79605 Pain in left leg: Secondary | ICD-10-CM

## 2018-09-22 DIAGNOSIS — M79604 Pain in right leg: Secondary | ICD-10-CM

## 2018-09-22 DIAGNOSIS — R2689 Other abnormalities of gait and mobility: Secondary | ICD-10-CM

## 2018-10-06 ENCOUNTER — Ambulatory Visit
Admission: RE | Admit: 2018-10-06 | Discharge: 2018-10-06 | Disposition: A | Discharge status: Home / Self Care | Payer: PPO | Source: Ambulatory Visit | Attending: Neurology | Admitting: Neurology

## 2018-10-06 ENCOUNTER — Other Ambulatory Visit: Payer: Self-pay

## 2018-10-06 DIAGNOSIS — M79605 Pain in left leg: Secondary | ICD-10-CM | POA: Diagnosis not present

## 2018-10-06 DIAGNOSIS — R202 Paresthesia of skin: Secondary | ICD-10-CM | POA: Diagnosis not present

## 2018-10-06 DIAGNOSIS — R2689 Other abnormalities of gait and mobility: Secondary | ICD-10-CM | POA: Insufficient documentation

## 2018-10-06 DIAGNOSIS — M79604 Pain in right leg: Secondary | ICD-10-CM

## 2018-10-06 DIAGNOSIS — M47812 Spondylosis without myelopathy or radiculopathy, cervical region: Secondary | ICD-10-CM | POA: Diagnosis not present

## 2018-10-12 DIAGNOSIS — L814 Other melanin hyperpigmentation: Secondary | ICD-10-CM | POA: Diagnosis not present

## 2018-10-12 DIAGNOSIS — L570 Actinic keratosis: Secondary | ICD-10-CM | POA: Diagnosis not present

## 2018-10-12 DIAGNOSIS — L821 Other seborrheic keratosis: Secondary | ICD-10-CM | POA: Diagnosis not present

## 2018-10-12 DIAGNOSIS — Z85828 Personal history of other malignant neoplasm of skin: Secondary | ICD-10-CM | POA: Diagnosis not present

## 2018-10-12 DIAGNOSIS — D229 Melanocytic nevi, unspecified: Secondary | ICD-10-CM | POA: Diagnosis not present

## 2018-10-12 DIAGNOSIS — L820 Inflamed seborrheic keratosis: Secondary | ICD-10-CM | POA: Diagnosis not present

## 2018-10-27 DIAGNOSIS — H35372 Puckering of macula, left eye: Secondary | ICD-10-CM | POA: Diagnosis not present

## 2018-11-11 DIAGNOSIS — G608 Other hereditary and idiopathic neuropathies: Secondary | ICD-10-CM | POA: Diagnosis not present

## 2018-11-11 DIAGNOSIS — R2689 Other abnormalities of gait and mobility: Secondary | ICD-10-CM | POA: Diagnosis not present

## 2018-11-11 DIAGNOSIS — G250 Essential tremor: Secondary | ICD-10-CM | POA: Diagnosis not present

## 2018-11-11 DIAGNOSIS — R252 Cramp and spasm: Secondary | ICD-10-CM | POA: Diagnosis not present

## 2018-12-01 DIAGNOSIS — R300 Dysuria: Secondary | ICD-10-CM | POA: Diagnosis not present

## 2018-12-01 DIAGNOSIS — H6692 Otitis media, unspecified, left ear: Secondary | ICD-10-CM | POA: Diagnosis not present

## 2018-12-05 ENCOUNTER — Other Ambulatory Visit: Payer: Self-pay | Admitting: Nurse Practitioner

## 2018-12-06 NOTE — Telephone Encounter (Signed)
Routing to provider  

## 2018-12-06 NOTE — Telephone Encounter (Signed)
Requested medication (s) are due for refill today: yes  Requested medication (s) are on the active medication list: yes  Last refill: 09/06/2018  Future visit scheduled: No  Notes to clinic:  Historical provider    Requested Prescriptions  Pending Prescriptions Disp Refills   rOPINIRole (REQUIP) 0.5 MG tablet [Pharmacy Med Name: ROPINIROLE HCL 0.5 MG TABLET] 90 tablet 1    Sig: TAKE 1 TABLET (0.5 MG TOTAL) BY MOUTH AT BEDTIME.     Neurology:  Parkinsonian Agents Passed - 12/05/2018  9:42 AM      Passed - Last BP in normal range    BP Readings from Last 1 Encounters:  09/14/18 129/83         Passed - Valid encounter within last 12 months    Recent Outpatient Visits          2 months ago Wound of left foot   [facility] [place], [place] T, NP   3 months ago Wound of left foot   [place], [place] T, NP   3 months ago Cellulitis of foot, left   [place], [place], [place]   3 months ago Cellulitis of foot, left   [place], [place]   5 months ago Nocturnal leg cramps   [place], Barbaraann Faster, NP

## 2018-12-07 ENCOUNTER — Telehealth: Payer: Self-pay | Admitting: Nurse Practitioner

## 2018-12-07 NOTE — Telephone Encounter (Signed)
[facility] and ear drum infection. Pt was prescribed amoxicillin 875-125mg , but was advised that she still has the UTI and Pt wants to know if Jolene can prescribe something to take care of the UTI as well as the ear drum infection that is also still bothering her/ please advise

## 2018-12-08 NOTE — Telephone Encounter (Signed)
I would recommend a visit, as Augmentin (the medicine she was prescribed) should have covered both.  If having ongoing issues would recommend in office visit or return to urgent care if no times available in office.  That way we can recheck urine and ear.  Thank you.

## 2018-12-08 NOTE — Telephone Encounter (Signed)
Appt scheduled with Apolonio Schneiders.

## 2018-12-10 ENCOUNTER — Ambulatory Visit: Payer: PPO | Admitting: Nurse Practitioner

## 2018-12-10 ENCOUNTER — Ambulatory Visit (INDEPENDENT_AMBULATORY_CARE_PROVIDER_SITE_OTHER): Payer: PPO | Admitting: Family Medicine

## 2018-12-10 ENCOUNTER — Encounter: Payer: Self-pay | Admitting: Family Medicine

## 2018-12-10 ENCOUNTER — Other Ambulatory Visit: Payer: Self-pay

## 2018-12-10 VITALS — BP 111/75 | HR 53 | Temp 98.2°F | Ht 64.6 in | Wt 110.0 lb

## 2018-12-10 DIAGNOSIS — H66002 Acute suppurative otitis media without spontaneous rupture of ear drum, left ear: Secondary | ICD-10-CM

## 2018-12-10 DIAGNOSIS — G4762 Sleep related leg cramps: Secondary | ICD-10-CM

## 2018-12-10 MED ORDER — FLUTICASONE PROPIONATE 50 MCG/ACT NA SUSP: 2.0000 | Freq: Two times a day (BID) | NASAL | 6 refills | Status: DC

## 2018-12-10 MED ORDER — DOXYCYCLINE HYCLATE 100 MG PO TABS: 100.0000 mg | ORAL_TABLET | Freq: Two times a day (BID) | ORAL | 0 refills | Status: DC

## 2018-12-10 NOTE — Progress Notes (Signed)
BP 111/75   Pulse (!) 53   Temp 98.2 F (36.8 C) (Oral)   Ht 5' 4.6" (1.641 m)   Wt 110 lb (49.9 kg)   BMI 18.53 kg/m    Subjective:    Patient ID: Cassandra [name], female    DOB: Nov 23, 1940, 78 y.o.   MRN: [id number]  HPI: Cassandra [name] is a 78 y.o. female  Chief Complaint  Patient presents with  . Ear Pain    left dided, ongoing 1-2 months, went to [facility], given antibiotic, not working well  . Leg cramps    pt states that the ropinirole, carbidopa are not helping   Patient presenting today with ongoing left ear pain and pressure the past 1-2 months. Some muffled hearing. Tried an antibiotic over a month ago but that didn't clear things up. Trying wax drops now with minimal relief. Denies fevers, chills, congestion, sore throat, headaches.   Also wanting to discuss her ongoing RLS and leg cramping. Has been working with Neurology for quite some time on this issue. Recently had sinemet dose increased which so far doesn't seem to be helping with her sxs. They are becoming increasingly bothersome to her and she cannot seem to find relief no matter what is tried.   Relevant past medical, surgical, family and social history reviewed and updated as indicated. Interim medical history since our last visit reviewed. Allergies and medications reviewed and updated.  Review of Systems  Per HPI unless specifically indicated above     Objective:    BP 111/75   Pulse (!) 53   Temp 98.2 F (36.8 C) (Oral)   Ht 5' 4.6" (1.641 m)   Wt 110 lb (49.9 kg)   BMI 18.53 kg/m   Wt Readings from Last 3 Encounters:  12/10/18 110 lb (49.9 kg)  09/14/18 113 lb (51.3 kg)  06/10/18 114 lb (51.7 kg)    Physical Exam Vitals signs and nursing note reviewed.  Constitutional:      Appearance: Normal appearance. She is not ill-appearing.  HENT:     Head: Atraumatic.     Right Ear: Tympanic membrane and ear canal normal.     Ears:     Comments: Left TM bulging with purulent fluid  present behind TM Eyes:     Extraocular Movements: Extraocular movements intact.     Conjunctiva/sclera: Conjunctivae normal.  Neck:     Musculoskeletal: Normal range of motion and neck supple.  Cardiovascular:     Rate and Rhythm: Normal rate and regular rhythm.     Heart sounds: Normal heart sounds.  Pulmonary:     Effort: Pulmonary effort is normal.     Breath sounds: Normal breath sounds.  Musculoskeletal: Normal range of motion.  Skin:    General: Skin is warm and dry.  Neurological:     Mental Status: She is alert and oriented to person, place, and time.  Psychiatric:        Mood and Affect: Mood normal.        Thought Content: Thought content normal.        Judgment: Judgment normal.     Results for orders placed or performed in visit on 05/10/18  Magnesium  Result Value Ref Range   Magnesium 2.4 (H) 1.6 - 2.3 mg/dL  Thyroid Panel With TSH  Result Value Ref Range   TSH 1.810 0.450 - 4.500 uIU/mL   T4, Total 7.4 4.5 - 12.0 ug/dL   T3 Uptake Ratio 21 (L)  24 - 39 %   Free Thyroxine Index 1.6 1.2 - 4.9  Comp Met (CMET)  Result Value Ref Range   Glucose 79 65 - 99 mg/dL   BUN 25 8 - 27 mg/dL   Creatinine, Ser 0.78 0.57 - 1.00 mg/dL   GFR calc non Af Amer 75 >59 mL/min/1.73   GFR calc Af Amer 86 >59 mL/min/1.73   BUN/Creatinine Ratio 32 (H) 12 - 28   Sodium 141 134 - 144 mmol/L   Potassium 4.8 3.5 - 5.2 mmol/L   Chloride 102 96 - 106 mmol/L   CO2 26 20 - 29 mmol/L   Calcium 10.4 (H) 8.7 - 10.3 mg/dL   Total Protein 6.5 6.0 - 8.5 g/dL   Albumin 4.9 (H) 3.7 - 4.7 g/dL   Globulin, Total 1.6 1.5 - 4.5 g/dL   Albumin/Globulin Ratio 3.1 (H) 1.2 - 2.2   Bilirubin Total 0.3 0.0 - 1.2 mg/dL   Alkaline Phosphatase 66 39 - 117 IU/L   AST 25 0 - 40 IU/L   ALT 17 0 - 32 IU/L  CBC w/Diff  Result Value Ref Range   WBC 4.9 3.4 - 10.8 x10E3/uL   RBC 5.21 3.77 - 5.28 x10E6/uL   Hemoglobin 14.0 11.1 - 15.9 g/dL   MCV 84 79 - 97 fL   MCH 26.9 26.6 - 33.0 pg   MCHC 31.9 31.5  - 35.7 g/dL   RDW 14.6 11.7 - 15.4 %   Platelets 283 150 - 450 x10E3/uL   Neutrophils 59 Not Estab. %   Lymphs 32 Not Estab. %   Monocytes 6 Not Estab. %   Eos 2 Not Estab. %   Basos 1 Not Estab. %   Neutrophils Absolute 2.8 1.4 - 7.0 x10E3/uL   Lymphocytes Absolute 1.6 0.7 - 3.1 x10E3/uL   Monocytes Absolute 0.3 0.1 - 0.9 x10E3/uL   EOS (ABSOLUTE) 0.1 0.0 - 0.4 x10E3/uL   Basophils Absolute 0.0 0.0 - 0.2 x10E3/uL   Immature Granulocytes 0 Not Estab. %   Immature Grans (Abs) 0.0 0.0 - 0.1 x10E3/uL  Anemia panel  Result Value Ref Range   Total Iron Binding Capacity 369 250 - 450 ug/dL   UIBC 310 118 - 369 ug/dL   Iron 59 27 - 139 ug/dL   Iron Saturation 16 15 - 55 %   Vitamin B-12 416 232 - 1,245 pg/mL   Folate, Hemolysate 398.0 Not Estab. ng/mL   Hematocrit 43.9 34.0 - 46.6 %   Folate, RBC 907 >498 ng/mL   Ferritin 46 15 - 150 ng/mL   Retic Ct Pct 0.6 0.6 - 2.6 %  Lipid Profile  Result Value Ref Range   Cholesterol, Total 212 (H) 100 - 199 mg/dL   Triglycerides 67 0 - 149 mg/dL   HDL 88 >39 mg/dL   VLDL Cholesterol Cal 13 5 - 40 mg/dL   LDL Calculated 111 (H) 0 - 99 mg/dL   Chol/HDL Ratio 2.4 0.0 - 4.4 ratio      Assessment & Plan:   Problem List Items Addressed This Visit      Other   Nocturnal leg cramps    Increased sinemet dose not helping. Discussed contacting Neurologist about this as they are currently managing this issue. Patient agreeable to doing so.        Other Visit Diagnoses    Acute suppurative otitis media of left ear without spontaneous rupture of tympanic membrane, recurrence not specified    -  Primary  Will tx with doxycycline and flonase and monitor for benefit. May need ENT referral if not clearing given persistence   Relevant Medications   doxycycline (VIBRA-TABS) 100 MG tablet       Follow up plan: Return for as scheduled.

## 2018-12-13 NOTE — Assessment & Plan Note (Signed)
Increased sinemet dose not helping. Discussed contacting Neurologist about this as they are currently managing this issue. Patient agreeable to doing so.

## 2018-12-15 ENCOUNTER — Other Ambulatory Visit: Payer: Self-pay

## 2018-12-15 ENCOUNTER — Encounter: Payer: Self-pay | Admitting: Family Medicine

## 2018-12-15 ENCOUNTER — Ambulatory Visit (INDEPENDENT_AMBULATORY_CARE_PROVIDER_SITE_OTHER): Payer: PPO | Admitting: Family Medicine

## 2018-12-15 VITALS — BP 127/68 | HR 53 | Temp 98.8°F | Ht 64.6 in | Wt 112.0 lb

## 2018-12-15 DIAGNOSIS — H9202 Otalgia, left ear: Secondary | ICD-10-CM | POA: Diagnosis not present

## 2018-12-15 DIAGNOSIS — G8929 Other chronic pain: Secondary | ICD-10-CM

## 2018-12-15 MED ORDER — PREDNISONE 10 MG PO TABS: ORAL_TABLET | ORAL | 0 refills | Status: DC

## 2018-12-15 NOTE — Progress Notes (Signed)
BP 127/68   Pulse (!) 53   Temp 98.8 F (37.1 C) (Oral)   Ht 5' 4.6" (1.641 m)   Wt 112 lb (50.8 kg)   SpO2 97%   BMI 18.87 kg/m    Subjective:    Patient ID: Cassandra [name], female    DOB: [birth year], 78 y.o.   MRN: [id number]  HPI: Cassandra [name] is a 78 y.o. female  Chief Complaint  Patient presents with  . Ear Pain    f/u   Presenting today with continued left ear pain and pressure the past 2 months or so despite now 2 rounds of abx and addition of flonase BID. Minimal improvement in sxs. Still with muffled hearing and some soreness radiating down toward jaw. Now having some intermittent sharp pains in right side as well though not nearly as bad. Has a few days left on her current antibiotic. No fevers, chills, congestion, sore throat, cough, CP, SOB.   Relevant past medical, surgical, family and social history reviewed and updated as indicated. Interim medical history since our last visit reviewed. Allergies and medications reviewed and updated.  Review of Systems  Per HPI unless specifically indicated above     Objective:    BP 127/68   Pulse (!) 53   Temp 98.8 F (37.1 C) (Oral)   Ht 5' 4.6" (1.641 m)   Wt 112 lb (50.8 kg)   SpO2 97%   BMI 18.87 kg/m   Wt Readings from Last 3 Encounters:  12/15/18 112 lb (50.8 kg)  12/10/18 110 lb (49.9 kg)  09/14/18 113 lb (51.3 kg)    Physical Exam HENT:     Ears:     Comments: Mild middle ear effusion on right, TM and EAC benign No change to left ear, still with edematous TM and purulent fluid behind    Results for orders placed or performed in visit on 05/10/18  Magnesium  Result Value Ref Range   Magnesium 2.4 (H) 1.6 - 2.3 mg/dL  Thyroid Panel With TSH  Result Value Ref Range   TSH 1.810 0.450 - 4.500 uIU/mL   T4, Total 7.4 4.5 - 12.0 ug/dL   T3 Uptake Ratio 21 (L) 24 - 39 %   Free Thyroxine Index 1.6 1.2 - 4.9  Comp Met (CMET)  Result Value Ref Range   Glucose 79 65 - 99 mg/dL   BUN 25 8 - 27  mg/dL   Creatinine, Ser 0.78 0.57 - 1.00 mg/dL   GFR calc non Af Amer 75 >59 mL/min/1.73   GFR calc Af Amer 86 >59 mL/min/1.73   BUN/Creatinine Ratio 32 (H) 12 - 28   Sodium 141 134 - 144 mmol/L   Potassium 4.8 3.5 - 5.2 mmol/L   Chloride 102 96 - 106 mmol/L   CO2 26 20 - 29 mmol/L   Calcium 10.4 (H) 8.7 - 10.3 mg/dL   Total Protein 6.5 6.0 - 8.5 g/dL   Albumin 4.9 (H) 3.7 - 4.7 g/dL   Globulin, Total 1.6 1.5 - 4.5 g/dL   Albumin/Globulin Ratio 3.1 (H) 1.2 - 2.2   Bilirubin Total 0.3 0.0 - 1.2 mg/dL   Alkaline Phosphatase 66 39 - 117 IU/L   AST 25 0 - 40 IU/L   ALT 17 0 - 32 IU/L  CBC w/Diff  Result Value Ref Range   WBC 4.9 3.4 - 10.8 x10E3/uL   RBC 5.21 3.77 - 5.28 x10E6/uL   Hemoglobin 14.0 11.1 - 15.9 g/dL  MCV 84 79 - 97 fL   MCH 26.9 26.6 - 33.0 pg   MCHC 31.9 31.5 - 35.7 g/dL   RDW 14.6 11.7 - 15.4 %   Platelets 283 150 - 450 x10E3/uL   Neutrophils 59 Not Estab. %   Lymphs 32 Not Estab. %   Monocytes 6 Not Estab. %   Eos 2 Not Estab. %   Basos 1 Not Estab. %   Neutrophils Absolute 2.8 1.4 - 7.0 x10E3/uL   Lymphocytes Absolute 1.6 0.7 - 3.1 x10E3/uL   Monocytes Absolute 0.3 0.1 - 0.9 x10E3/uL   EOS (ABSOLUTE) 0.1 0.0 - 0.4 x10E3/uL   Basophils Absolute 0.0 0.0 - 0.2 x10E3/uL   Immature Granulocytes 0 Not Estab. %   Immature Grans (Abs) 0.0 0.0 - 0.1 x10E3/uL  Anemia panel  Result Value Ref Range   Total Iron Binding Capacity 369 250 - 450 ug/dL   UIBC 310 118 - 369 ug/dL   Iron 59 27 - 139 ug/dL   Iron Saturation 16 15 - 55 %   Vitamin B-12 416 232 - 1,245 pg/mL   Folate, Hemolysate 398.0 Not Estab. ng/mL   Hematocrit 43.9 34.0 - 46.6 %   Folate, RBC 907 >498 ng/mL   Ferritin 46 15 - 150 ng/mL   Retic Ct Pct 0.6 0.6 - 2.6 %  Lipid Profile  Result Value Ref Range   Cholesterol, Total 212 (H) 100 - 199 mg/dL   Triglycerides 67 0 - 149 mg/dL   HDL 88 >39 mg/dL   VLDL Cholesterol Cal 13 5 - 40 mg/dL   LDL Calculated 111 (H) 0 - 99 mg/dL   Chol/HDL Ratio  2.4 0.0 - 4.4 ratio      Assessment & Plan:   Problem List Items Addressed This Visit    None    Visit Diagnoses    Chronic left ear pain    -  Primary   Persistent effusion. Will add prednisone to current regimen and refer to ENT given chronicity and severity of discomfort   Relevant Medications   predniSONE (DELTASONE) 10 MG tablet   Other Relevant Orders   Ambulatory referral to ENT       Follow up plan: Return if symptoms worsen or fail to improve.

## 2018-12-17 ENCOUNTER — Telehealth: Payer: Self-pay

## 2018-12-17 NOTE — Telephone Encounter (Signed)
Looks like the order got pended instead of signed. Placed as urgent, and yes she should be taking that prednisone daily as discussed. Hope that gets her some relief!

## 2018-12-17 NOTE — Telephone Encounter (Signed)
Copied from [place] [id number]. Topic: General - Inquiry >> Dec 17, 2018  8:20 AM Scherrie Gerlach wrote: Reason for CRM: pt states she has not heard from an ENT.  She thought Rachael was going to refer her.  She called on her on, and could not get appt. Pt has one day left of the abx prescribed 8/28.  Then Apolonio Schneiders prescribed prednisone. Pt wants to know if she should take abx with prednisone? Pt is still having ear pain and going out of town on Monday.   Routing to provider. Do not see a referral in the chart.

## 2018-12-17 NOTE — Telephone Encounter (Signed)
Patient notified of Rachel's message and [facility] also notified of urgent referral.

## 2018-12-17 NOTE — Telephone Encounter (Signed)
Called patient, no answer, left a message for patient to return my call.  

## 2019-01-04 DIAGNOSIS — H903 Sensorineural hearing loss, bilateral: Secondary | ICD-10-CM | POA: Diagnosis not present

## 2019-01-04 DIAGNOSIS — M26623 Arthralgia of bilateral temporomandibular joint: Secondary | ICD-10-CM | POA: Diagnosis not present

## 2019-01-04 DIAGNOSIS — H90A22 Sensorineural hearing loss, unilateral, left ear, with restricted hearing on the contralateral side: Secondary | ICD-10-CM | POA: Diagnosis not present

## 2019-02-04 DIAGNOSIS — G608 Other hereditary and idiopathic neuropathies: Secondary | ICD-10-CM | POA: Diagnosis not present

## 2019-02-04 DIAGNOSIS — G2581 Restless legs syndrome: Secondary | ICD-10-CM | POA: Diagnosis not present

## 2019-02-04 DIAGNOSIS — R2689 Other abnormalities of gait and mobility: Secondary | ICD-10-CM | POA: Diagnosis not present

## 2019-02-04 DIAGNOSIS — G250 Essential tremor: Secondary | ICD-10-CM | POA: Diagnosis not present

## 2019-02-04 DIAGNOSIS — M542 Cervicalgia: Secondary | ICD-10-CM | POA: Diagnosis not present

## 2019-02-15 ENCOUNTER — Telehealth: Payer: PPO | Admitting: Nurse Practitioner

## 2019-02-15 ENCOUNTER — Ambulatory Visit: Payer: PPO | Admitting: Nurse Practitioner

## 2019-02-24 ENCOUNTER — Encounter: Payer: PPO | Admitting: Family Medicine

## 2019-02-24 DIAGNOSIS — G2581 Restless legs syndrome: Secondary | ICD-10-CM | POA: Diagnosis not present

## 2019-02-24 DIAGNOSIS — R790 Abnormal level of blood mineral: Secondary | ICD-10-CM | POA: Diagnosis not present

## 2019-03-03 ENCOUNTER — Ambulatory Visit (INDEPENDENT_AMBULATORY_CARE_PROVIDER_SITE_OTHER): Payer: PPO | Admitting: Family Medicine

## 2019-03-03 ENCOUNTER — Other Ambulatory Visit: Payer: Self-pay

## 2019-03-03 DIAGNOSIS — R300 Dysuria: Secondary | ICD-10-CM

## 2019-03-03 DIAGNOSIS — Z538 Procedure and treatment not carried out for other reasons: Secondary | ICD-10-CM

## 2019-03-03 LAB — UA/M W/RFLX CULTURE, ROUTINE
Bilirubin, UA: NEGATIVE
Glucose, UA: NEGATIVE
Ketones, UA: NEGATIVE
Leukocytes,UA: NEGATIVE
Nitrite, UA: NEGATIVE
Protein,UA: NEGATIVE
Specific Gravity, UA: 1.025 (ref 1.005–1.030)
Urobilinogen, Ur: 0.2 mg/dL (ref 0.2–1.0)
pH, UA: 5.5 (ref 5.0–7.5)

## 2019-03-03 LAB — MICROSCOPIC EXAMINATION

## 2019-03-06 NOTE — Progress Notes (Signed)
Unable to get patient on the phone- numbers not working.

## 2019-03-07 ENCOUNTER — Telehealth: Payer: Self-pay | Admitting: Nurse Practitioner

## 2019-03-07 DIAGNOSIS — Z03818 Encounter for observation for suspected exposure to other biological agents ruled out: Secondary | ICD-10-CM | POA: Diagnosis not present

## 2019-03-07 NOTE — Telephone Encounter (Signed)
Routing to provider. Did not see result not or message on labs.

## 2019-03-07 NOTE — Telephone Encounter (Signed)
She did not answer the phone for an appointment. Needs an appointment.

## 2019-03-07 NOTE — Telephone Encounter (Signed)
Please call and scheduled appointment ASAP with Dr. Wynetta Emery.

## 2019-03-07 NOTE — Telephone Encounter (Signed)
LVM for pt to call back and schedule appt. 

## 2019-03-07 NOTE — Telephone Encounter (Signed)
Patient calling to check status of lab results from 11/19.

## 2019-03-08 NOTE — Telephone Encounter (Signed)
Am aware of this, she does need appointment, as missed appointment call with Dr. Wynetta Emery to discuss results and acute issues.

## 2019-03-08 NOTE — Telephone Encounter (Signed)
I have full schedule tomorrow, could she come in next Monday or Tuesday?

## 2019-03-08 NOTE — Telephone Encounter (Signed)
Pt called in to be advised. Pt says that she is not sure how she missed her phone visit. Pt says that she have her phone with her all the time. I verified /updated pt's info. Assisting pt with a new apt, not showing anything until next week, pt would like to know if possible could provider see her before the holiday?   Please assist pt.

## 2019-03-08 NOTE — Telephone Encounter (Signed)
LVM for pt to call back to schedule appt

## 2019-03-09 NOTE — Telephone Encounter (Signed)
Noted  

## 2019-03-09 NOTE — Telephone Encounter (Signed)
Called pt to schedule her today at 3:15, no answer, left vm

## 2019-04-27 DIAGNOSIS — H35372 Puckering of macula, left eye: Secondary | ICD-10-CM | POA: Diagnosis not present

## 2019-04-27 DIAGNOSIS — H02889 Meibomian gland dysfunction of unspecified eye, unspecified eyelid: Secondary | ICD-10-CM | POA: Diagnosis not present

## 2019-06-01 ENCOUNTER — Ambulatory Visit: Payer: PPO | Admitting: Nurse Practitioner

## 2019-06-06 ENCOUNTER — Ambulatory Visit (INDEPENDENT_AMBULATORY_CARE_PROVIDER_SITE_OTHER): Payer: PPO | Admitting: Nurse Practitioner

## 2019-06-06 ENCOUNTER — Encounter: Payer: Self-pay | Admitting: Nurse Practitioner

## 2019-06-06 ENCOUNTER — Other Ambulatory Visit: Payer: Self-pay

## 2019-06-06 VITALS — BP 96/61 | HR 69 | Temp 98.9°F

## 2019-06-06 DIAGNOSIS — G2581 Restless legs syndrome: Secondary | ICD-10-CM | POA: Diagnosis not present

## 2019-06-06 DIAGNOSIS — F5104 Psychophysiologic insomnia: Secondary | ICD-10-CM | POA: Diagnosis not present

## 2019-06-06 DIAGNOSIS — G4700 Insomnia, unspecified: Secondary | ICD-10-CM | POA: Insufficient documentation

## 2019-06-06 DIAGNOSIS — G4762 Sleep related leg cramps: Secondary | ICD-10-CM | POA: Diagnosis not present

## 2019-06-06 MED ORDER — SLOW FE 142 (45 FE) MG PO TBCR: 1.0000 | EXTENDED_RELEASE_TABLET | Freq: Every day | ORAL | 7 refills | Status: DC

## 2019-06-06 MED ORDER — TRAZODONE HCL 50 MG PO TABS: 25.0000 mg | ORAL_TABLET | Freq: Every evening | ORAL | 3 refills | Status: DC | PRN

## 2019-06-06 NOTE — Progress Notes (Signed)
BP 96/61   Pulse 69   Temp 98.9 F (37.2 C) (Oral)   SpO2 99%    Subjective:    Patient ID: Cassandra [name], female    DOB: 05-31-1940, 79 y.o.   MRN: [id number]  HPI: [name] is a 79 y.o. female  Chief Complaint  Patient presents with  . Follow-up   RESTLESS LEGS & INSOMNIA Continues on Requip 16 MG QHS and Sinemet 1 tablet QID.  Followed by neurology, last saw in October, and is scheduled to see again tomorrow.  Takes ferrous sulfate every day and fish oil.  Does endorse increased stressors recently with sister having breast CA, her sister lives with her at this time.  Having a hard time sleeping due to restless leg and increased stress.   Duration: chronic Discomfort description:  cramping Pain: yes Location: lower legs Bilateral: yes Symmetric: yes Severity: moderate Onset:  gradual Frequency:  intermittent mainly at night Symptoms only occur while legs at rest: yes Sudden unintentional leg jerking: no Bed partner bothered by leg movements: no LE numbness: yes Decreased sensation: yes Weakness: no Insomnia: yes Daytime somnolence: no Fatigue: no Alleviating factors:  Aggravating factors:  Status: fluctuating Treatments attempted: Requip and Sinemet  Relevant past medical, surgical, family and social history reviewed and updated as indicated. Interim medical history since our last visit reviewed. Allergies and medications reviewed and updated.  Review of Systems  Constitutional: Negative for activity change, appetite change, diaphoresis, fatigue and fever.  Respiratory: Negative for cough, chest tightness and shortness of breath.   Cardiovascular: Negative for chest pain, palpitations and leg swelling.  Gastrointestinal: Negative.   Musculoskeletal: Positive for arthralgias.  Neurological: Negative.   Psychiatric/Behavioral: Positive for sleep disturbance. Negative for decreased concentration, self-injury and suicidal ideas. The patient is  nervous/anxious.     Per HPI unless specifically indicated above     Objective:    BP 96/61   Pulse 69   Temp 98.9 F (37.2 C) (Oral)   SpO2 99%   Wt Readings from Last 3 Encounters:  12/15/18 112 lb (50.8 kg)  12/10/18 110 lb (49.9 kg)  09/14/18 113 lb (51.3 kg)    Physical Exam Vitals and nursing note reviewed.  Constitutional:      General: She is awake. She is not in acute distress.    Appearance: She is well-developed and well-groomed. She is not ill-appearing.  HENT:     Head: Normocephalic.     Right Ear: Hearing normal.     Left Ear: Hearing normal.  Eyes:     General: Lids are normal.        Right eye: No discharge.        Left eye: No discharge.     Conjunctiva/sclera: Conjunctivae normal.     Pupils: Pupils are equal, round, and reactive to light.  Neck:     Vascular: No carotid bruit.  Cardiovascular:     Rate and Rhythm: Normal rate and regular rhythm.     Heart sounds: Normal heart sounds. No murmur. No gallop.   Pulmonary:     Effort: Pulmonary effort is normal. No accessory muscle usage or respiratory distress.     Breath sounds: Normal breath sounds.  Abdominal:     General: Bowel sounds are normal.     Palpations: Abdomen is soft.  Musculoskeletal:     Cervical back: Normal range of motion and neck supple.     Right lower leg: No edema.  Left lower leg: No edema.  Skin:    General: Skin is warm and dry.  Neurological:     Mental Status: She is alert and oriented to person, place, and time.  Psychiatric:        Attention and Perception: Attention normal.        Mood and Affect: Mood normal.        Speech: Speech normal.        Behavior: Behavior normal. Behavior is cooperative.        Thought Content: Thought content normal.     Results for orders placed or performed in visit on 03/03/19  Microscopic Examination   URINE  Result Value Ref Range   WBC, UA 0-5 0 - 5 /hpf   RBC 0-2 0 - 2 /hpf   Epithelial Cells (non renal) 0-10 0 - 10  /hpf   Mucus, UA Present Not Estab.   Bacteria, UA Few (A) None seen/Few  UA/M w/rflx Culture, Routine   Specimen: Urine   URINE  Result Value Ref Range   Specific Gravity, UA 1.025 1.005 - 1.030   pH, UA 5.5 5.0 - 7.5   Color, UA Yellow Yellow   Appearance Ur Clear Clear   Leukocytes,UA Negative Negative   Protein,UA Negative Negative/Trace   Glucose, UA Negative Negative   Ketones, UA Negative Negative   RBC, UA Trace (A) Negative   Bilirubin, UA Negative Negative   Urobilinogen, Ur 0.2 0.2 - 1.0 mg/dL   Nitrite, UA Negative Negative   Microscopic Examination See below:       Assessment & Plan:   Problem List Items Addressed This Visit      Other   Restless leg syndrome - Primary    Chronic, ongoing.  Followed by neurology.  Continue Requip and Sinemet doses at this time.  Check CBC, iron, ferritin, Mag, CMP, and TSH today.  Adjust plan of care as needed.  Continue collaboration with neurology.      Relevant Orders   CBC with Differential/Platelet   Comprehensive metabolic panel   Magnesium   TSH   Iron, TIBC and Ferritin Panel   Insomnia    Suspect related to increased stressors and RLS.  Will trial low dose Trazodone 25-50 MG by mouth at night as needed, this will benefit both mood and sleep.  Script sent.  Educated on sleep hygiene techniques.  Return to office in 6 weeks for follow-up, sooner if worsening.          Follow up plan: Return in about 6 weeks (around 07/18/2019) for Insomnia.

## 2019-06-06 NOTE — Assessment & Plan Note (Signed)
Chronic, ongoing.  Followed by neurology.  Continue Requip and Sinemet doses at this time.  Check CBC, iron, ferritin, Mag, CMP, and TSH today.  Adjust plan of care as needed.  Continue collaboration with neurology.

## 2019-06-06 NOTE — Assessment & Plan Note (Signed)
Suspect related to increased stressors and RLS.  Will trial low dose Trazodone 25-50 MG by mouth at night as needed, this will benefit both mood and sleep.  Script sent.  Educated on sleep hygiene techniques.  Return to office in 6 weeks for follow-up, sooner if worsening.

## 2019-06-06 NOTE — Patient Instructions (Signed)

## 2019-06-07 DIAGNOSIS — R790 Abnormal level of blood mineral: Secondary | ICD-10-CM | POA: Diagnosis not present

## 2019-06-07 DIAGNOSIS — G2581 Restless legs syndrome: Secondary | ICD-10-CM | POA: Diagnosis not present

## 2019-06-07 DIAGNOSIS — G608 Other hereditary and idiopathic neuropathies: Secondary | ICD-10-CM | POA: Diagnosis not present

## 2019-06-07 LAB — MAGNESIUM: Magnesium: 2.2 mg/dL (ref 1.6–2.3)

## 2019-06-07 LAB — CBC WITH DIFFERENTIAL/PLATELET
Basophils Absolute: 0 10*3/uL (ref 0.0–0.2)
Basos: 1 %
EOS (ABSOLUTE): 0.1 10*3/uL (ref 0.0–0.4)
Eos: 1 %
Hematocrit: 41.2 % (ref 34.0–46.6)
Hemoglobin: 13.4 g/dL (ref 11.1–15.9)
Immature Grans (Abs): 0 10*3/uL (ref 0.0–0.1)
Immature Granulocytes: 0 %
Lymphocytes Absolute: 1.7 10*3/uL (ref 0.7–3.1)
Lymphs: 30 %
MCH: 27.9 pg (ref 26.6–33.0)
MCHC: 32.5 g/dL (ref 31.5–35.7)
MCV: 86 fL (ref 79–97)
Monocytes Absolute: 0.4 10*3/uL (ref 0.1–0.9)
Monocytes: 6 %
Neutrophils Absolute: 3.5 10*3/uL (ref 1.4–7.0)
Neutrophils: 62 %
Platelets: 269 10*3/uL (ref 150–450)
RBC: 4.8 x10E6/uL (ref 3.77–5.28)
RDW: 13.1 % (ref 11.7–15.4)
WBC: 5.6 10*3/uL (ref 3.4–10.8)

## 2019-06-07 LAB — COMPREHENSIVE METABOLIC PANEL
ALT: 7 IU/L (ref 0–32)
AST: 21 IU/L (ref 0–40)
Albumin/Globulin Ratio: 2.3 — ABNORMAL HIGH (ref 1.2–2.2)
Albumin: 4.4 g/dL (ref 3.7–4.7)
Alkaline Phosphatase: 87 IU/L (ref 39–117)
BUN/Creatinine Ratio: 33 — ABNORMAL HIGH (ref 12–28)
BUN: 23 mg/dL (ref 8–27)
Bilirubin Total: 0.2 mg/dL (ref 0.0–1.2)
CO2: 21 mmol/L (ref 20–29)
Calcium: 9.4 mg/dL (ref 8.7–10.3)
Chloride: 106 mmol/L (ref 96–106)
Creatinine, Ser: 0.7 mg/dL (ref 0.57–1.00)
GFR calc Af Amer: 97 mL/min/{1.73_m2} (ref >59)
GFR calc non Af Amer: 84 mL/min/{1.73_m2} (ref >59)
Globulin, Total: 1.9 g/dL (ref 1.5–4.5)
Glucose: 85 mg/dL (ref 65–99)
Potassium: 4.1 mmol/L (ref 3.5–5.2)
Sodium: 142 mmol/L (ref 134–144)
Total Protein: 6.3 g/dL (ref 6.0–8.5)

## 2019-06-07 LAB — IRON,TIBC AND FERRITIN PANEL
Ferritin: 109 ng/mL (ref 15–150)
Iron Saturation: 18 % (ref 15–55)
Iron: 58 ug/dL (ref 27–139)
Total Iron Binding Capacity: 327 ug/dL (ref 250–450)
UIBC: 269 ug/dL (ref 118–369)

## 2019-06-07 LAB — TSH: TSH: 1.45 u[IU]/mL (ref 0.450–4.500)

## 2019-06-07 NOTE — Progress Notes (Signed)
Please let Rema know that overall test results returned within normal ranges, no changes needed at this time.  Have a great day!!

## 2019-06-20 DIAGNOSIS — L84 Corns and callosities: Secondary | ICD-10-CM | POA: Diagnosis not present

## 2019-07-18 ENCOUNTER — Ambulatory Visit: Payer: PPO | Admitting: Unknown Physician Specialty

## 2019-08-08 ENCOUNTER — Telehealth: Payer: Self-pay | Admitting: Nurse Practitioner

## 2019-08-08 NOTE — Telephone Encounter (Signed)
Copied from [place] [id number]. Topic: General - Other >> Aug 08, 2019 10:12 AM Yvette Rack wrote: Reason for CRM: Pt stated she needs to speak with her doctor or the nurse to discuss getting some medication to regulate her going to the bathroom. Pt requests call back

## 2019-08-08 NOTE — Telephone Encounter (Signed)
Patient will need an OV correct?

## 2019-08-08 NOTE — Telephone Encounter (Signed)
Please get patient scheduled.  °

## 2019-08-08 NOTE — Telephone Encounter (Signed)
Yep, will need office visit.

## 2019-08-09 NOTE — Telephone Encounter (Signed)
Lvm for to make this apt.

## 2019-08-10 NOTE — Telephone Encounter (Signed)
Lvm to make this apt. 

## 2019-08-11 NOTE — Telephone Encounter (Signed)
Lvm to make this apt. 

## 2019-10-19 ENCOUNTER — Telehealth: Payer: Self-pay | Admitting: Nurse Practitioner

## 2019-10-19 NOTE — Telephone Encounter (Signed)
Copied from [place] [phone]. Topic: Medicare AWV >> Oct 19, 2019 10:57 AM Cher Nakai R wrote: Reason for CRM:  Left message for patient to call back and schedule Medicare Annual Wellness Visit (AWV) to be done virtually.  No hx of AWV per [facility] as of 04/14/2009  Please schedule at anytime with [facility] Health Advisor.      49 Minutes appointment

## 2019-12-08 ENCOUNTER — Other Ambulatory Visit: Payer: Self-pay

## 2019-12-08 DIAGNOSIS — Z20822 Contact with and (suspected) exposure to covid-19: Secondary | ICD-10-CM | POA: Diagnosis not present

## 2019-12-09 LAB — NOVEL CORONAVIRUS, NAA: SARS-CoV-2, NAA: NOT DETECTED

## 2019-12-09 LAB — SARS-COV-2, NAA 2 DAY TAT

## 2019-12-22 ENCOUNTER — Ambulatory Visit
Admission: RE | Admit: 2019-12-22 | Discharge: 2019-12-22 | Disposition: A | Discharge status: Home / Self Care | Payer: PPO | Source: Ambulatory Visit | Attending: Otolaryngology | Admitting: Otolaryngology

## 2019-12-22 ENCOUNTER — Other Ambulatory Visit: Payer: Self-pay | Admitting: Otolaryngology

## 2019-12-22 ENCOUNTER — Other Ambulatory Visit: Payer: Self-pay

## 2019-12-22 DIAGNOSIS — G250 Essential tremor: Secondary | ICD-10-CM | POA: Diagnosis not present

## 2019-12-22 DIAGNOSIS — R059 Cough, unspecified: Secondary | ICD-10-CM

## 2019-12-22 DIAGNOSIS — R2689 Other abnormalities of gait and mobility: Secondary | ICD-10-CM | POA: Diagnosis not present

## 2019-12-22 DIAGNOSIS — J300 Vasomotor rhinitis: Secondary | ICD-10-CM | POA: Diagnosis not present

## 2019-12-22 DIAGNOSIS — G629 Polyneuropathy, unspecified: Secondary | ICD-10-CM | POA: Diagnosis not present

## 2019-12-22 DIAGNOSIS — R05 Cough: Secondary | ICD-10-CM | POA: Insufficient documentation

## 2019-12-22 DIAGNOSIS — R490 Dysphonia: Secondary | ICD-10-CM | POA: Diagnosis not present

## 2019-12-22 DIAGNOSIS — G2581 Restless legs syndrome: Secondary | ICD-10-CM | POA: Diagnosis not present

## 2020-01-12 ENCOUNTER — Telehealth: Payer: Self-pay | Admitting: Nurse Practitioner

## 2020-01-12 DIAGNOSIS — D485 Neoplasm of uncertain behavior of skin: Secondary | ICD-10-CM | POA: Diagnosis not present

## 2020-01-12 DIAGNOSIS — Z85820 Personal history of malignant melanoma of skin: Secondary | ICD-10-CM | POA: Diagnosis not present

## 2020-01-12 DIAGNOSIS — C44712 Basal cell carcinoma of skin of right lower limb, including hip: Secondary | ICD-10-CM | POA: Diagnosis not present

## 2020-01-12 DIAGNOSIS — D2261 Melanocytic nevi of right upper limb, including shoulder: Secondary | ICD-10-CM | POA: Diagnosis not present

## 2020-01-12 DIAGNOSIS — D225 Melanocytic nevi of trunk: Secondary | ICD-10-CM | POA: Diagnosis not present

## 2020-01-12 DIAGNOSIS — D2271 Melanocytic nevi of right lower limb, including hip: Secondary | ICD-10-CM | POA: Diagnosis not present

## 2020-01-12 DIAGNOSIS — L821 Other seborrheic keratosis: Secondary | ICD-10-CM | POA: Diagnosis not present

## 2020-01-12 NOTE — Telephone Encounter (Signed)
Copied from [place] [id number]. Topic: Medicare AWV >> Jan 12, 2020 10:12 AM Cher Nakai R wrote: Reason for CRM:  Left message for patient to call back and schedule Medicare Annual Wellness Visit (AWV) to be done virtually.  No hx of AWV  Please schedule at anytime with [facility] Health Advisor.      31 Minutes appointment   Any questions, please call me at [phone]

## 2020-01-23 DIAGNOSIS — H35372 Puckering of macula, left eye: Secondary | ICD-10-CM | POA: Diagnosis not present

## 2020-02-02 DIAGNOSIS — D485 Neoplasm of uncertain behavior of skin: Secondary | ICD-10-CM | POA: Diagnosis not present

## 2020-02-02 DIAGNOSIS — D3611 Benign neoplasm of peripheral nerves and autonomic nervous system of face, head, and neck: Secondary | ICD-10-CM | POA: Diagnosis not present

## 2020-02-02 DIAGNOSIS — L570 Actinic keratosis: Secondary | ICD-10-CM | POA: Diagnosis not present

## 2020-02-02 DIAGNOSIS — C44712 Basal cell carcinoma of skin of right lower limb, including hip: Secondary | ICD-10-CM | POA: Diagnosis not present

## 2020-02-27 DIAGNOSIS — H35372 Puckering of macula, left eye: Secondary | ICD-10-CM | POA: Diagnosis not present

## 2020-06-08 ENCOUNTER — Other Ambulatory Visit: Payer: Self-pay

## 2020-06-08 ENCOUNTER — Ambulatory Visit (INDEPENDENT_AMBULATORY_CARE_PROVIDER_SITE_OTHER): Payer: PPO | Admitting: Internal Medicine

## 2020-06-08 VITALS — BP 132/68 | HR 68 | Temp 97.8°F | Ht 64.0 in | Wt 113.2 lb

## 2020-06-08 DIAGNOSIS — S9031XA Contusion of right foot, initial encounter: Secondary | ICD-10-CM | POA: Diagnosis not present

## 2020-06-08 MED ORDER — CEPHALEXIN 500 MG PO CAPS: 500.0000 mg | ORAL_CAPSULE | Freq: Two times a day (BID) | ORAL | 0 refills | Status: DC

## 2020-06-08 NOTE — Progress Notes (Signed)
   BP 132/68   Pulse 68   Temp 97.8 F (36.6 C) (Oral)   Ht 5\' 4"  (1.626 m)   Wt 113 lb 3.2 oz (51.3 kg)   SpO2 98%   BMI 19.43 kg/m    Subjective:    Patient ID: Cassandra [name], female    DOB: 1941/03/16, 80 y.o.   MRN: [id number]  HPI: Cassandra [name] is a 80 y.o. female  Pt is here for a fu on her foot injury / heel pain   Foot Injury  The incident occurred more than 1 week ago (heel has a  callous x 2 days ago when she started hurteing had a red spot with white spot and not bruised). The incident occurred at home (pain goes up her left leg ). The pain is present in the right foot. The quality of the pain is described as aching and stabbing. The pain is at a severity of 5/10. Pertinent negatives include no inability to bear weight, loss of motion, muscle weakness, numbness or tingling.    Chief Complaint  Patient presents with  . Foot Injury    Hit her left heel on some furniture about 4 to 5 days ago, became very painful last night, patient states 10/10 pain and could not get it to go away.    Relevant past medical, surgical, family and social history reviewed and updated as indicated. Interim medical history since our last visit reviewed. Allergies and medications reviewed and updated.  Review of Systems  Constitutional: Negative for activity change and appetite change.  Respiratory: Negative for apnea, cough, choking, chest tightness and shortness of breath.   Cardiovascular: Negative for chest pain.  Endocrine: Negative for cold intolerance.  Neurological: Negative for dizziness, tingling and numbness.    Per HPI unless specifically indicated above     Objective:    BP 132/68   Pulse 68   Temp 97.8 F (36.6 C) (Oral)   Ht 5\' 4"  (1.626 m)   Wt 113 lb 3.2 oz (51.3 kg)   SpO2 98%   BMI 19.43 kg/m   Wt Readings from Last 3 Encounters:  06/08/20 113 lb 3.2 oz (51.3 kg)  12/15/18 112 lb (50.8 kg)  12/10/18 110 lb (49.9 kg)    Physical Exam Skin:          Comments: Patient has a bruised Lewis to purpleish discolored rated callus on the right heel along with erythema around the surrounding areas.     Results for orders placed or performed in visit on 12/08/19  Novel Coronavirus, NAA ([facility])   Specimen: Nasopharyngeal(NP) swabs in vial transport medium   Nasopharynge  Screenin  Result Value Ref Range   SARS-CoV-2, NAA Not Detected Not Detected  SARS-COV-2, NAA 2 DAY TAT   Nasopharynge  Screenin  Result Value Ref Range   SARS-CoV-2, NAA 2 DAY TAT Performed       Assessment & Plan:  1. Right foot/ heel inflammation with hematoma and possible infection in surrounding skin.  Will start pt on keflex : Consider wound care Fu in 1 week as nurse visit.  Wound dressed with neopsorin    Problem List Items Addressed This Visit   None      Follow up plan: No follow-ups on file.

## 2020-06-15 ENCOUNTER — Ambulatory Visit
Admission: RE | Admit: 2020-06-15 | Discharge: 2020-06-15 | Disposition: A | Discharge status: Home / Self Care | Payer: PPO | Source: Ambulatory Visit | Attending: Internal Medicine | Admitting: Internal Medicine

## 2020-06-15 ENCOUNTER — Telehealth: Payer: Self-pay

## 2020-06-15 ENCOUNTER — Other Ambulatory Visit: Payer: Self-pay

## 2020-06-15 ENCOUNTER — Encounter: Payer: Self-pay | Admitting: Internal Medicine

## 2020-06-15 ENCOUNTER — Ambulatory Visit (INDEPENDENT_AMBULATORY_CARE_PROVIDER_SITE_OTHER): Payer: PPO | Admitting: Internal Medicine

## 2020-06-15 VITALS — BP 111/70 | HR 48 | Temp 98.3°F | Ht 64.0 in | Wt 113.1 lb

## 2020-06-15 DIAGNOSIS — L97529 Non-pressure chronic ulcer of other part of left foot with unspecified severity: Secondary | ICD-10-CM | POA: Insufficient documentation

## 2020-06-15 DIAGNOSIS — M19072 Primary osteoarthritis, left ankle and foot: Secondary | ICD-10-CM | POA: Diagnosis not present

## 2020-06-15 DIAGNOSIS — Z9189 Other specified personal risk factors, not elsewhere classified: Secondary | ICD-10-CM

## 2020-06-15 MED ORDER — CEPHALEXIN 500 MG PO CAPS: 500.0000 mg | ORAL_CAPSULE | Freq: Two times a day (BID) | ORAL | 0 refills | Status: DC

## 2020-06-15 MED ORDER — SULFAMETHOXAZOLE-TRIMETHOPRIM 800-160 MG PO TABS: 1.0000 | ORAL_TABLET | Freq: Two times a day (BID) | ORAL | 0 refills | Status: DC

## 2020-06-15 NOTE — Telephone Encounter (Signed)
I believe this is report you had ordered recently at visit.:)

## 2020-06-15 NOTE — Telephone Encounter (Signed)
Imaging report in chart

## 2020-06-15 NOTE — Progress Notes (Signed)
BP 111/70   Pulse (!) 48   Temp 98.3 F (36.8 C) (Oral)   Ht _0  (1.626 m)   Wt 113 lb 1.5 oz (51.3 kg)   SpO2 98%   BMI 19.41 kg/m    Subjective:    Patient ID: Cassandra [name], female    DOB: June 20, 1940, 80 y.o.   MRN: [id number]  HPI: Cassandra [name] is a 80 y.o. female  Foot Injury  The incident occurred more than 1 week ago (ulcer better non purulent however erythema continues around the surrounding skin and is status post Keflex x10 days). The pain is at a severity of 4/10. The pain is mild. Pertinent negatives include no inability to bear weight, loss of sensation, muscle weakness, numbness or tingling.    Chief Complaint  Patient presents with  . Heel check    Patient states that her heel is doing much better and has a day left of ABX to take    Relevant past medical, surgical, family and social history reviewed and updated as indicated. Interim medical history since our last visit reviewed. Allergies and medications reviewed and updated.  Review of Systems  Constitutional: Negative for activity change, appetite change, chills, diaphoresis, fatigue, fever and unexpected weight change.  Skin: Positive for color change and wound. Negative for pallor.       Left foot ulcer noted partially healed but with surrounding erythema  Neurological: Negative for tingling and numbness.    Per HPI unless specifically indicated above     Objective:    BP 111/70   Pulse (!) 48   Temp 98.3 F (36.8 C) (Oral)   Ht _1  (1.626 m)   Wt 113 lb 1.5 oz (51.3 kg)   SpO2 98%   BMI 19.41 kg/m   Wt Readings from Last 3 Encounters:  06/15/20 113 lb 1.5 oz (51.3 kg)  06/08/20 113 lb 3.2 oz (51.3 kg)  12/15/18 112 lb (50.8 kg)    Physical Exam Constitutional:      General: She is not in acute distress.    Appearance: Normal appearance. She is not ill-appearing, toxic-appearing or diaphoretic.  Musculoskeletal:       Feet:  Feet:     Left foot:     Skin integrity:  Erythema present.  Neurological:     Mental Status: She is alert.     Results for orders placed or performed in visit on 12/08/19  Novel Coronavirus, NAA ([facility])   Specimen: Nasopharyngeal(NP) swabs in vial transport medium   Nasopharynge  Screenin  Result Value Ref Range   SARS-CoV-2, NAA Not Detected Not Detected  SARS-COV-2, NAA 2 DAY TAT   Nasopharynge  Screenin  Result Value Ref Range   SARS-CoV-2, NAA 2 DAY TAT Performed       Assessment & Plan:  1.  Left foot ulcer with surrounding erythema noted  - will change antibiotics to Bactrim DS.   Wound culture today anaerobic and aerobic. We will check x-ray of the foot to rule out osteo-.  Consider CRP ESR next visit if x-ray positive. Consider referral to wound clinic if unhealing/nonhealing. Consider checking A1c next visit as well. Patient needs to keep the wound dry and wash with soap and water cover with dry gauze    Problem List Items Addressed This Visit   None   Visit Diagnoses    At risk for loss of bone density    -  Primary   Relevant Orders  DG Bone Density   Ulcer of left foot, unspecified ulcer stage ([name])       Relevant Orders   DG Foot Complete Left   CBC with Differential/Platelet       Follow up plan: Return in about 4 days (around 06/19/2020).

## 2020-06-18 NOTE — Progress Notes (Signed)
Pl let her know xray was normal.

## 2020-06-18 NOTE — Progress Notes (Signed)
Patient has staph aureus  noted in cultures sensitive to Bactrim patient is on such -recently changed since last visit  pl let her know and see if she is feeling better

## 2020-06-19 ENCOUNTER — Encounter: Payer: Self-pay | Admitting: Internal Medicine

## 2020-06-19 ENCOUNTER — Telehealth: Payer: Self-pay

## 2020-06-19 ENCOUNTER — Ambulatory Visit (INDEPENDENT_AMBULATORY_CARE_PROVIDER_SITE_OTHER): Payer: PPO | Admitting: Internal Medicine

## 2020-06-19 ENCOUNTER — Other Ambulatory Visit: Payer: Self-pay

## 2020-06-19 VITALS — BP 106/64 | HR 61 | Temp 98.2°F | Ht 64.0 in | Wt 113.0 lb

## 2020-06-19 DIAGNOSIS — L97529 Non-pressure chronic ulcer of other part of left foot with unspecified severity: Secondary | ICD-10-CM | POA: Diagnosis not present

## 2020-06-19 LAB — ANAEROBIC AND AEROBIC CULTURE

## 2020-06-19 NOTE — Telephone Encounter (Signed)
Copied from [place] [id number]. Topic: General - Other >> Jun 19, 2020 10:27 AM Leward Quan A wrote: Reason for CRM: Patient called in to inform Dr Neomia Dear that she went to the pharmacy and there was no medication there that was sent yesterday. Patient asking for Rx to be sent to the pharmacy please and call back at Ph#  [phone]

## 2020-06-19 NOTE — Progress Notes (Signed)
   BP 106/64   Pulse 61   Temp 98.2 F (36.8 C)   Ht 5\' 4"  (1.626 m)   Wt 113 lb (51.3 kg)   SpO2 97%   BMI 19.40 kg/m    Subjective:    Patient ID: Cassandra [name], female    DOB: Dec 01, 1940, 80 y.o.   MRN: [id number]  HPI: Cassandra [name] is a 80 y.o. female  Patient is here for follow-up on her right heel ulcer looks much better today.  Patient did not pick up Bactrim however her culture did show she was positive for staph aureus which was sensitive to Bactrim.    No chief complaint on file.   Relevant past medical, surgical, family and social history reviewed and updated as indicated. Interim medical history since our last visit reviewed. Allergies and medications reviewed and updated.  Review of Systems  Constitutional: Negative for activity change, appetite change, chills, diaphoresis, fatigue and fever.  Skin: Negative for color change and pallor.    Per HPI unless specifically indicated above     Objective:    BP 106/64   Pulse 61   Temp 98.2 F (36.8 C)   Ht 5\' 4"  (1.626 m)   Wt 113 lb (51.3 kg)   SpO2 97%   BMI 19.40 kg/m   Wt Readings from Last 3 Encounters:  06/19/20 113 lb (51.3 kg)  06/15/20 113 lb 1.5 oz (51.3 kg)  06/08/20 113 lb 3.2 oz (51.3 kg)    Physical Exam Feet:     Comments: Wound looks much improved less fluid/purulent no purulence     Results for orders placed or performed in visit on 06/15/20  Anaerobic and Aerobic Culture   Specimen: Ear, Right; Sterile Swab   HE  Result Value Ref Range   Anaerobic Culture WILL FOLLOW    Aerobic Culture Final report (A)    Result 1 Staphylococcus aureus (A)    Result 2 Enterococcus faecalis (A)    Antimicrobial Susceptibility Comment       Assessment & Plan:  1. Foot ulcer - much improved pt advised to continue dry dressings with gauze To fill bactrim and start asap.  Pt to fu in 2 weeks with me for ov.     Problem List Items Addressed This Visit   None      Follow up  plan: No follow-ups on file.

## 2020-06-19 NOTE — Telephone Encounter (Signed)
Routing to provider  

## 2020-06-22 DIAGNOSIS — R2689 Other abnormalities of gait and mobility: Secondary | ICD-10-CM | POA: Diagnosis not present

## 2020-06-22 DIAGNOSIS — G629 Polyneuropathy, unspecified: Secondary | ICD-10-CM | POA: Diagnosis not present

## 2020-06-22 DIAGNOSIS — G2581 Restless legs syndrome: Secondary | ICD-10-CM | POA: Diagnosis not present

## 2020-06-22 DIAGNOSIS — G250 Essential tremor: Secondary | ICD-10-CM | POA: Diagnosis not present

## 2020-06-27 ENCOUNTER — Other Ambulatory Visit: Payer: Self-pay

## 2020-06-27 ENCOUNTER — Ambulatory Visit (INDEPENDENT_AMBULATORY_CARE_PROVIDER_SITE_OTHER): Payer: PPO | Admitting: Nurse Practitioner

## 2020-06-27 ENCOUNTER — Encounter: Payer: Self-pay | Admitting: Nurse Practitioner

## 2020-06-27 VITALS — BP 101/69 | HR 81 | Temp 98.9°F | Wt 113.4 lb

## 2020-06-27 DIAGNOSIS — L97529 Non-pressure chronic ulcer of other part of left foot with unspecified severity: Secondary | ICD-10-CM

## 2020-06-27 DIAGNOSIS — G2581 Restless legs syndrome: Secondary | ICD-10-CM

## 2020-06-27 DIAGNOSIS — G629 Polyneuropathy, unspecified: Secondary | ICD-10-CM | POA: Diagnosis not present

## 2020-06-27 DIAGNOSIS — G250 Essential tremor: Secondary | ICD-10-CM | POA: Diagnosis not present

## 2020-06-27 NOTE — Assessment & Plan Note (Signed)
Chronic, ongoing.  Followed by neurology.  Continue Requip and Lyrica doses at this time, as prescribed by them.  Check CBC, iron, ferritin, CMP, and TSH today.  Adjust plan of care as needed.  Continue collaboration with neurology.

## 2020-06-27 NOTE — Assessment & Plan Note (Signed)
Chronic, ongoing, followed by neurology (Dr. Manuella Ghazi).  Continue this collaboration and current medication regimen as prescribed by him.  Obtain labs today, check kidney function with Lyrica on board.  Return in 6 months.

## 2020-06-27 NOTE — Patient Instructions (Signed)
Wound Care, Adult Taking care of your wound properly can help to prevent pain, infection, and scarring. It can also help your wound heal more quickly. Follow instructions from your health care provider about how to care for your wound. Supplies needed:  Soap and water.  Wound cleanser.  Gauze.  If needed, a clean bandage (dressing) or other type of wound dressing material to cover or place in the wound. Follow your health care provider's instructions about what dressing supplies to use.  Cream or ointment to apply to the wound, if told by your health care provider. How to care for your wound Cleaning the wound Ask your health care provider how to clean the wound. This may include:  Using mild soap and water or a wound cleanser.  Using a clean gauze to pat the wound dry after cleaning it. Do not rub or scrub the wound. Dressing care  Wash your hands with soap and water for at least 20 seconds before and after you change the dressing. If soap and water are not available, use hand sanitizer.  Change your dressing as told by your health care provider. This may include: ? Cleaning or rinsing out (irrigating) the wound. ? Placing a dressing over the wound or in the wound (packing). ? Covering the wound with an outer dressing.  Leave any stitches (sutures), skin glue, or adhesive strips in place. These skin closures may need to stay in place for 2 weeks or longer. If adhesive strip edges start to loosen and curl up, you may trim the loose edges. Do not remove adhesive strips completely unless your health care provider tells you to do that.  Ask your health care provider when you can leave the wound uncovered. Checking for infection Check your wound area every day for signs of infection. Check for:  More redness, swelling, or pain.  Fluid or blood.  Warmth.  Pus or a bad smell.   Follow these instructions at home Medicines  If you were prescribed an antibiotic medicine, cream, or  ointment, take or apply it as told by your health care provider. Do not stop using the antibiotic even if your condition improves.  If you were prescribed pain medicine, take it 30 minutes before you do any wound care or as told by your health care provider.  Take over-the-counter and prescription medicines only as told by your health care provider. Eating and drinking  Eat a diet that includes protein, vitamin A, vitamin C, and other nutrient-rich foods to help the wound heal. ? Foods rich in protein include meat, fish, eggs, dairy, beans, and nuts. ? Foods rich in vitamin A include carrots and dark green, leafy vegetables. ? Foods rich in vitamin C include citrus fruits, tomatoes, broccoli, and peppers.  Drink enough fluid to keep your urine pale yellow. General instructions  Do not take baths, swim, use a hot tub, or do anything that would put the wound underwater until your health care provider approves. Ask your health care provider if you may take showers. You may only be allowed to take sponge baths.  Do not scratch or pick at the wound. Keep it covered as told by your health care provider.  Return to your normal activities as told by your health care provider. Ask your health care provider what activities are safe for you.  Protect your wound from the sun when you are outside for the first 6 months, or for as long as told by your health care provider. Cover   up the scar area or apply sunscreen that has an SPF of at least 30.  Do not use any products that contain nicotine or tobacco, such as cigarettes, e-cigarettes, and chewing tobacco. These may delay wound healing. If you need help quitting, ask your health care provider.  Keep all follow-up visits as told by your health care provider. This is important. Contact a health care provider if:  You received a tetanus shot and you have swelling, severe pain, redness, or bleeding at the injection site.  Your pain is not controlled  with medicine.  You have any of these signs of infection: ? More redness, swelling, or pain around the wound. ? Fluid or blood coming from the wound. ? Warmth coming from the wound. ? Pus or a bad smell coming from the wound. ? A fever or chills.  You are nauseous or you vomit.  You are dizzy. Get help right away if:  You have a red streak of skin near the area around your wound.  Your wound has been closed with staples, sutures, skin glue, or adhesive strips and it begins to open up and separate.  Your wound is bleeding, and the bleeding does not stop with gentle pressure.  You have a rash.  You faint.  You have trouble breathing. These symptoms may represent a serious problem that is an emergency. Do not wait to see if the symptoms will go away. Get medical help right away. Call your local emergency services (911 in the [place] Do not drive yourself to the hospital. Summary  Always wash your hands with soap and water for at least 20 seconds before and after changing your dressing.  Change your dressing as told by your health care provider.  To help with healing, eat foods that are rich in protein, vitamin A, vitamin C, and other nutrients.  Check your wound every day for signs of infection. Contact your health care provider if you suspect that your wound is infected. This information is not intended to replace advice given to you by your health care provider. Make sure you discuss any questions you have with your health care provider. Document Revised: 01/14/2019 Document Reviewed: 01/14/2019 Elsevier Patient Education  2021 [employer]  

## 2020-06-27 NOTE — Progress Notes (Signed)
BP 101/69   Pulse 81   Temp 98.9 F (37.2 C) (Oral)   Wt 113 lb 6.4 oz (51.4 kg)   SpO2 97%   BMI 19.47 kg/m    Subjective:    Patient ID: Cassandra [name], female    DOB: October 05, 1940, 80 y.o.   MRN: [id number]  HPI: Cassandra [name] is a 80 y.o. female  Chief Complaint  Patient presents with  . Foot Ulcer Follow Up    Patient states she the ulcers have gotten better and she is able to wear shoes and she doesn't have the wound cover anymore.   FOOT WOUND Follow-up today for wound to left foot/heel -- was seen 06/08/20 and treated with Keflex and wound dressed with neosporin -- follow-up on 06/15/20 culture was obtained and changed to Bactrim DS.   Culture returned with staph aureus & enterococcus faecalis susceptible to Bactrim and Keflex.  Imaging noted no acute abnormality.    Currently reports wound is healing and can wear shoe + not have to cover wound.  Initially wound presented after hitting on piece of sharp furniture while caring for sister who has cancer.  Has underlying sensorimotor neuropathy and followed by neurology -- saw 06/22/20 -- increased Lyrica to 50 MG BID for one week and then to increase to 100 MG BID.  No recent labs noted on chart -- has underlying RLS and takes Requip + history of anemia. Duration: weeks Involved foot: left Mechanism of injury: trauma Weakness with weight bearing or walking: no Morning stiffness: no Swelling: no Redness: no Bruising: no Paresthesias / decreased sensation: yes  Fevers:no  Relevant past medical, surgical, family and social history reviewed and updated as indicated. Interim medical history since our last visit reviewed. Allergies and medications reviewed and updated.  Review of Systems  Constitutional: Negative.   Respiratory: Negative.   Cardiovascular: Negative.     Per HPI unless specifically indicated above     Objective:    BP 101/69   Pulse 81   Temp 98.9 F (37.2 C) (Oral)   Wt 113 lb 6.4 oz (51.4 kg)    SpO2 97%   BMI 19.47 kg/m   Wt Readings from Last 3 Encounters:  06/27/20 113 lb 6.4 oz (51.4 kg)  06/19/20 113 lb (51.3 kg)  06/15/20 113 lb 1.5 oz (51.3 kg)    Physical Exam Vitals and nursing note reviewed.  Constitutional:      General: She is awake. She is not in acute distress.    Appearance: She is well-developed and well-groomed. She is not ill-appearing.  HENT:     Head: Normocephalic.     Right Ear: Hearing normal.     Left Ear: Hearing normal.  Eyes:     General: Lids are normal.        Right eye: No discharge.        Left eye: No discharge.     Conjunctiva/sclera: Conjunctivae normal.     Pupils: Pupils are equal, round, and reactive to light.  Cardiovascular:     Rate and Rhythm: Normal rate and regular rhythm.     Pulses:          Dorsalis pedis pulses are 2+ on the right side and 2+ on the left side.       Posterior tibial pulses are 2+ on the right side and 2+ on the left side.     Heart sounds: Normal heart sounds. No murmur heard. No gallop.   Pulmonary:  Effort: Pulmonary effort is normal. No accessory muscle usage or respiratory distress.     Breath sounds: Normal breath sounds.  Abdominal:     General: Bowel sounds are normal.     Palpations: Abdomen is soft. There is no hepatomegaly.  Musculoskeletal:     Cervical back: Normal range of motion and neck supple.     Right lower leg: No edema.     Left lower leg: No edema.       Feet:  Feet:     Right foot:     Skin integrity: Skin integrity normal.     Toenail Condition: Right toenails are normal.     Left foot:     Skin integrity: Ulcer (improving with crusting now present ) present.     Toenail Condition: Left toenails are normal.  Skin:    General: Skin is warm and dry.  Neurological:     Mental Status: She is alert and oriented to person, place, and time.  Psychiatric:        Attention and Perception: Attention normal.        Mood and Affect: Mood normal.        Speech: Speech  normal.        Behavior: Behavior normal. Behavior is cooperative.        Thought Content: Thought content normal.     Results for orders placed or performed in visit on 06/15/20  Anaerobic and Aerobic Culture   Specimen: Ear, Right; Sterile Swab   HE  Result Value Ref Range   Anaerobic Culture Final report    Result 1 Comment    Aerobic Culture Final report (A)    Result 1 Staphylococcus aureus (A)    Result 2 Enterococcus faecalis (A)    Antimicrobial Susceptibility Comment       Assessment & Plan:   Problem List Items Addressed This Visit      Nervous and Auditory   Benign essential tremor    Chronic, ongoing, followed by neurology.  Continue current medication regimen as prescribed by them, recent note reviewed.      Sensorimotor neuropathy    Chronic, ongoing, followed by neurology (Dr. Manuella Ghazi).  Continue this collaboration and current medication regimen as prescribed by him.  Obtain labs today, check kidney function with Lyrica on board.  Return in 6 months.      Relevant Medications   pregabalin (LYRICA) 50 MG capsule     Other   Restless leg syndrome    Chronic, ongoing.  Followed by neurology.  Continue Requip and Lyrica doses at this time, as prescribed by them.  Check CBC, iron, ferritin, CMP, and TSH today.  Adjust plan of care as needed.  Continue collaboration with neurology.      Relevant Orders   CBC with Differential/Platelet   Iron, TIBC and Ferritin Panel   Vitamin B12   Ulcer of left foot ([place]) - Primary    Acute and improving at this time in patient with underlying sensorimotor neuropathy.  Wound has healed at this time with no further s/s infection.  Recommend she continue to monitor area until crusting no longer present, alert provider if any worsening presents.  Advised her to monitor feet daily due to neuropathy, monitor closely for wounds.  Check CMP, CBC, and A1c today.      Relevant Orders   Comprehensive metabolic panel   TSH   HgB A1c        Follow up plan: Return in about  6 months (around 12/28/2020) for RLS, NEUROPATHY, LIVER .

## 2020-06-27 NOTE — Assessment & Plan Note (Signed)
Chronic, ongoing, followed by neurology.  Continue current medication regimen as prescribed by them, recent note reviewed.

## 2020-06-27 NOTE — Assessment & Plan Note (Signed)
Acute and improving at this time in patient with underlying sensorimotor neuropathy.  Wound has healed at this time with no further s/s infection.  Recommend she continue to monitor area until crusting no longer present, alert provider if any worsening presents.  Advised her to monitor feet daily due to neuropathy, monitor closely for wounds.  Check CMP, CBC, and A1c today.

## 2020-07-02 LAB — CBC WITH DIFFERENTIAL/PLATELET
Basophils Absolute: 0 10*3/uL (ref 0.0–0.2)
Basos: 1 %
EOS (ABSOLUTE): 0.1 10*3/uL (ref 0.0–0.4)
Eos: 2 %
Hematocrit: 42.6 % (ref 34.0–46.6)
Hemoglobin: 14.1 g/dL (ref 11.1–15.9)
Immature Grans (Abs): 0 10*3/uL (ref 0.0–0.1)
Immature Granulocytes: 0 %
Lymphocytes Absolute: 1.5 10*3/uL (ref 0.7–3.1)
Lymphs: 42 %
MCH: 27.5 pg (ref 26.6–33.0)
MCHC: 33.1 g/dL (ref 31.5–35.7)
MCV: 83 fL (ref 79–97)
Monocytes Absolute: 0.2 10*3/uL (ref 0.1–0.9)
Monocytes: 6 %
Neutrophils Absolute: 1.7 10*3/uL (ref 1.4–7.0)
Neutrophils: 49 %
Platelets: 271 10*3/uL (ref 150–450)
RBC: 5.12 x10E6/uL (ref 3.77–5.28)
RDW: 13.3 % (ref 11.7–15.4)
WBC: 3.6 10*3/uL (ref 3.4–10.8)

## 2020-07-02 LAB — HEMOGLOBIN A1C
Est. average glucose Bld gHb Est-mCnc: 114 mg/dL
Hgb A1c MFr Bld: 5.6 % (ref 4.8–5.6)

## 2020-07-02 LAB — COMPREHENSIVE METABOLIC PANEL
ALT: 18 IU/L (ref 0–32)
AST: 33 IU/L (ref 0–40)
Albumin/Globulin Ratio: 2.7 — ABNORMAL HIGH (ref 1.2–2.2)
Albumin: 4.6 g/dL (ref 3.7–4.7)
Alkaline Phosphatase: 71 IU/L (ref 44–121)
BUN/Creatinine Ratio: 18 (ref 12–28)
BUN: 16 mg/dL (ref 8–27)
Bilirubin Total: 0.3 mg/dL (ref 0.0–1.2)
CO2: 22 mmol/L (ref 20–29)
Calcium: 9.8 mg/dL (ref 8.7–10.3)
Chloride: 102 mmol/L (ref 96–106)
Creatinine, Ser: 0.88 mg/dL (ref 0.57–1.00)
Globulin, Total: 1.7 g/dL (ref 1.5–4.5)
Glucose: 86 mg/dL (ref 65–99)
Potassium: 4.5 mmol/L (ref 3.5–5.2)
Sodium: 142 mmol/L (ref 134–144)
Total Protein: 6.3 g/dL (ref 6.0–8.5)
eGFR: 68 mL/min/{1.73_m2} (ref >59)

## 2020-07-02 LAB — IRON,TIBC AND FERRITIN PANEL
Ferritin: 106 ng/mL (ref 15–150)
Iron Saturation: 19 % (ref 15–55)
Iron: 59 ug/dL (ref 27–139)
Total Iron Binding Capacity: 309 ug/dL (ref 250–450)
UIBC: 250 ug/dL (ref 118–369)

## 2020-07-02 LAB — TSH: TSH: 1.82 u[IU]/mL (ref 0.450–4.500)

## 2020-07-02 LAB — VITAMIN B12: Vitamin B-12: 384 pg/mL (ref 232–1245)

## 2020-07-03 ENCOUNTER — Ambulatory Visit: Payer: PPO | Admitting: Nurse Practitioner

## 2020-07-03 NOTE — Progress Notes (Signed)
Contacted via MyChart   Good morning Allegra, your lab have returned.  Overall they remain stable and within normal ranges, however your Vitamin B12 level is on lower end of normal.  Goal is to have this above 300 and your level is just there at 384.  It has lowered some over past few years.  I would recommend starting Vitamin B12 1000 MCG every other day.  This is good for overall nervous system health, including neuropathy discomfort.  Any questions? Keep being awesome!!  Thank you for allowing me to participate in your care. Kindest regards, Javani Spratt

## 2020-08-08 DIAGNOSIS — Z85828 Personal history of other malignant neoplasm of skin: Secondary | ICD-10-CM | POA: Diagnosis not present

## 2020-08-08 DIAGNOSIS — D485 Neoplasm of uncertain behavior of skin: Secondary | ICD-10-CM | POA: Diagnosis not present

## 2020-08-08 DIAGNOSIS — Z85820 Personal history of malignant melanoma of skin: Secondary | ICD-10-CM | POA: Diagnosis not present

## 2020-08-08 DIAGNOSIS — L570 Actinic keratosis: Secondary | ICD-10-CM | POA: Diagnosis not present

## 2020-08-13 ENCOUNTER — Ambulatory Visit
Admission: RE | Admit: 2020-08-13 | Discharge: 2020-08-13 | Disposition: A | Discharge status: Home / Self Care | Payer: PPO | Source: Ambulatory Visit | Attending: Nurse Practitioner | Admitting: Nurse Practitioner

## 2020-08-13 ENCOUNTER — Encounter: Payer: Self-pay | Admitting: Nurse Practitioner

## 2020-08-13 ENCOUNTER — Other Ambulatory Visit: Payer: Self-pay

## 2020-08-13 ENCOUNTER — Ambulatory Visit
Admission: RE | Admit: 2020-08-13 | Discharge: 2020-08-13 | Disposition: A | Discharge status: Home / Self Care | Payer: PPO | Attending: Nurse Practitioner | Admitting: Nurse Practitioner

## 2020-08-13 ENCOUNTER — Ambulatory Visit (INDEPENDENT_AMBULATORY_CARE_PROVIDER_SITE_OTHER): Payer: PPO | Admitting: Nurse Practitioner

## 2020-08-13 VITALS — BP 110/69 | HR 52 | Temp 98.6°F | Wt 113.0 lb

## 2020-08-13 DIAGNOSIS — M19041 Primary osteoarthritis, right hand: Secondary | ICD-10-CM | POA: Diagnosis not present

## 2020-08-13 DIAGNOSIS — M7989 Other specified soft tissue disorders: Secondary | ICD-10-CM

## 2020-08-13 MED ORDER — DOXYCYCLINE HYCLATE 100 MG PO TABS: 100.0000 mg | ORAL_TABLET | Freq: Two times a day (BID) | ORAL | 0 refills | Status: DC

## 2020-08-13 NOTE — Progress Notes (Signed)
Acute Office Visit  Subjective:    Patient ID: Cassandra [name], female    DOB: 07-26-1940, 80 y.o.   MRN: [id number]  Chief Complaint  Patient presents with  . Hand Pain    Patient states for 2 days her index finger on right hand has been swollen and painful. Patient states she can not bend her finger. Patient believes middle finger is a little swollen too     HPI Patient is in today for pain and swelling of her right index finger  HAND PAIN  Duration: days 2 days Involved hand: right Mechanism of injury: unknown, has been working out in the yard Location: right index finger Onset: sudden Severity: severe  Quality: "hurts really bad" Frequency: constant Radiation: no Aggravating factors: moving/bending finger Alleviating factors: none Treatments attempted: ibuprofen, ice Relief with NSAIDs?: no Weakness: no Numbness: no Redness: yes Swelling:yes Bruising: no Fevers: no   Past Medical History:  Diagnosis Date  . Cancer ([place])    skin cancer  . Gout   . Hepatitis    40 years ago  . Irritable bowel syndrome    history of....  . Nerve damage of left foot     Past Surgical History:  Procedure Laterality Date  . ABDOMINAL HYSTERECTOMY    . BREAST SURGERY     BIOPSY  . CATARACT EXTRACTION W/PHACO Left 11/13/2015   Procedure: CATARACT EXTRACTION PHACO AND INTRAOCULAR LENS PLACEMENT (IOC);  Surgeon: Birder Robson, MD;  Location: [facility] ORS;  Service: Ophthalmology;  Laterality: Left;  [place] 00:31 AP% 11.9 CDE 3.72 Fluid pack  lot # [id number]  . CATARACT EXTRACTION W/PHACO Right 12/11/2015   Procedure: CATARACT EXTRACTION PHACO AND INTRAOCULAR LENS PLACEMENT (IOC);  Surgeon: Birder Robson, MD;  Location: [facility] ORS;  Service: Ophthalmology;  Laterality: Right;  Lot# [id number] H [place]: 00:42.4 AP%: 18.9 CDE:8.01   . COLONOSCOPY    . SKIN CANCER EXCISION    . TONSILLECTOMY      Family History  Problem Relation Age of Onset  . Kidney Stones Mother   . Cancer  Father   . Cancer Sister        colon and ovarian    Social History   Socioeconomic History  . Marital status: Married    Spouse name: Not on file  . Number of children: Not on file  . Years of education: Not on file  . Highest education level: Not on file  Occupational History  . Not on file  Tobacco Use  . Smoking status: Never Smoker  . Smokeless tobacco: Never Used  Vaping Use  . Vaping Use: Never used  Substance and Sexual Activity  . Alcohol use: No  . Drug use: No  . Sexual activity: Not on file  Other Topics Concern  . Not on file  Social History Narrative  . Not on file   Social Determinants of Health   Financial Resource Strain: Not on file  Food Insecurity: Not on file  Transportation Needs: Not on file  Physical Activity: Not on file  Stress: Not on file  Social Connections: Not on file  Intimate Partner Violence: Not on file    Outpatient Medications Prior to Visit  Medication Sig Dispense Refill  . Ferrous Sulfate (SLOW FE) 142 (45 Fe) MG TBCR Take 1 tablet by mouth daily. 30 tablet 7  . ipratropium (ATROVENT) 0.03 % nasal spray SMARTSIG:1-2 Spray(s) Both Nares 3 Times Daily PRN    . Omega-3 Fatty Acids (FISH OIL PO) Take 1,300  mg by mouth daily.    . pregabalin (LYRICA) 50 MG capsule Take 50 mg by mouth in the morning and at bedtime.    Marland Kitchen rOPINIRole (REQUIP XL) 4 MG 24 hr tablet Take 16 mg by mouth at bedtime.     . traZODone (DESYREL) 50 MG tablet Take 0.5-1 tablets (25-50 mg total) by mouth at bedtime as needed for sleep. 30 tablet 3  . pregabalin (LYRICA) 100 MG capsule Take 100 mg by mouth 2 (two) times daily. (Patient not taking: Reported on 08/13/2020)    . sulfamethoxazole-trimethoprim (BACTRIM DS) 800-160 MG tablet Take 1 tablet by mouth 2 (two) times daily. (Patient not taking: Reported on 08/13/2020) 7 tablet 0   No facility-administered medications prior to visit.    No Known Allergies  Review of Systems  Constitutional: Positive for  fatigue. Negative for fever.  HENT: Negative.   Respiratory: Negative.   Cardiovascular: Negative.   Genitourinary: Negative.   Musculoskeletal: Positive for arthralgias (right index finger) and joint swelling (right index finger).  Skin: Positive for color change (redness and swelling to right index finger).  Neurological: Negative.        Objective:    Physical Exam Vitals and nursing note reviewed.  Constitutional:      General: She is not in acute distress.    Appearance: Normal appearance.  HENT:     Head: Normocephalic.  Eyes:     Conjunctiva/sclera: Conjunctivae normal.  Cardiovascular:     Rate and Rhythm: Normal rate and regular rhythm.     Pulses: Normal pulses.     Heart sounds: Normal heart sounds.  Pulmonary:     Effort: Pulmonary effort is normal.     Breath sounds: Normal breath sounds.  Musculoskeletal:        General: Swelling (right index finger) present.     Cervical back: Normal range of motion.     Comments: Swelling and redness noted to entire right index finger and MCP joint. Mild swelling and bruising to right middle finger and MCP joint.   Skin:    General: Skin is warm.     Findings: Bruising (right middle finger) present.  Neurological:     General: No focal deficit present.     Mental Status: She is alert and oriented to person, place, and time.  Psychiatric:        Mood and Affect: Mood normal.        Behavior: Behavior normal.        Thought Content: Thought content normal.        Judgment: Judgment normal.     BP 110/69   Pulse (!) 52   Temp 98.6 F (37 C)   Wt 113 lb (51.3 kg)   SpO2 96%   BMI 19.40 kg/m  Wt Readings from Last 3 Encounters:  08/13/20 113 lb (51.3 kg)  06/27/20 113 lb 6.4 oz (51.4 kg)  06/19/20 113 lb (51.3 kg)    Health Maintenance Due  Topic Date Due  . COVID-19 Vaccine (3 - Pfizer risk 4-dose series) 07/19/2019    There are no preventive care reminders to display for this patient.   Lab Results   Component Value Date   TSH 1.820 06/27/2020   Lab Results  Component Value Date   WBC 3.6 06/27/2020   HGB 14.1 06/27/2020   HCT 42.6 06/27/2020   MCV 83 06/27/2020   PLT 271 06/27/2020   Lab Results  Component Value Date   NA 142 06/27/2020  K 4.5 06/27/2020   CO2 22 06/27/2020   GLUCOSE 86 06/27/2020   BUN 16 06/27/2020   CREATININE 0.88 06/27/2020   BILITOT 0.3 06/27/2020   ALKPHOS 71 06/27/2020   AST 33 06/27/2020   ALT 18 06/27/2020   PROT 6.3 06/27/2020   ALBUMIN 4.6 06/27/2020   CALCIUM 9.8 06/27/2020   EGFR 68 06/27/2020   Lab Results  Component Value Date   CHOL 212 (H) 05/10/2018   Lab Results  Component Value Date   HDL 88 05/10/2018   Lab Results  Component Value Date   LDLCALC 111 (H) 05/10/2018   Lab Results  Component Value Date   TRIG 67 05/10/2018   Lab Results  Component Value Date   CHOLHDL 2.4 05/10/2018   Lab Results  Component Value Date   HGBA1C 5.6 06/27/2020       Assessment & Plan:   Problem List Items Addressed This Visit      Other   Swelling of digit of right hand - Primary    Edema and erythema to right index finger and MCP joint. Warmth noted as well. Possible bite from working in the garden versus injury. Will get x-rays and treat with doxycycline. Follow-up in 1 week or sooner if noticing fevers or red streaks up her hand.      Relevant Orders   DG Hand Complete Right       Meds ordered this encounter  Medications  . doxycycline (VIBRA-TABS) 100 MG tablet    Sig: Take 1 tablet (100 mg total) by mouth 2 (two) times daily.    Dispense:  20 tablet    Refill:  0     Charyl Dancer, NP

## 2020-08-13 NOTE — Assessment & Plan Note (Addendum)
Edema and erythema to right index finger and MCP joint. Warmth noted as well. Possible bite from working in the garden versus injury. Will get x-rays and treat with doxycycline. Follow-up in 1 week or sooner if noticing fevers or red streaks up her hand. Td is up to date.

## 2020-08-13 NOTE — Patient Instructions (Signed)
It was great to see you!  Voltaren gel 4 times a day as needed for pain. You can keep putting ice on your hand as well. We will get x-rays and start doxycycline.   Let's follow-up in 1 week, sooner if you have concerns.  If a referral was placed today, you will be contacted for an appointment. Please note that routine referrals can sometimes take up to 3-4 weeks to process. Please call our office if you haven't heard anything after this time frame.  Take care,  Vance Peper, NP

## 2020-08-16 DIAGNOSIS — L570 Actinic keratosis: Secondary | ICD-10-CM | POA: Diagnosis not present

## 2020-08-16 DIAGNOSIS — X32XXXA Exposure to sunlight, initial encounter: Secondary | ICD-10-CM | POA: Diagnosis not present

## 2020-08-20 ENCOUNTER — Ambulatory Visit: Payer: PPO | Admitting: Nurse Practitioner

## 2020-09-19 DIAGNOSIS — M2011 Hallux valgus (acquired), right foot: Secondary | ICD-10-CM | POA: Diagnosis not present

## 2020-09-19 DIAGNOSIS — M2012 Hallux valgus (acquired), left foot: Secondary | ICD-10-CM | POA: Diagnosis not present

## 2020-10-12 ENCOUNTER — Telehealth: Payer: Self-pay

## 2020-10-12 ENCOUNTER — Other Ambulatory Visit: Payer: Self-pay

## 2020-10-12 ENCOUNTER — Ambulatory Visit: Payer: Self-pay | Admitting: *Deleted

## 2020-10-12 ENCOUNTER — Encounter: Payer: Self-pay | Admitting: Nurse Practitioner

## 2020-10-12 ENCOUNTER — Ambulatory Visit (INDEPENDENT_AMBULATORY_CARE_PROVIDER_SITE_OTHER): Payer: PPO | Admitting: Nurse Practitioner

## 2020-10-12 VITALS — BP 95/63 | HR 56 | Temp 98.2°F | Wt 112.0 lb

## 2020-10-12 DIAGNOSIS — R197 Diarrhea, unspecified: Secondary | ICD-10-CM | POA: Diagnosis not present

## 2020-10-12 NOTE — Telephone Encounter (Signed)
noted 

## 2020-10-12 NOTE — Telephone Encounter (Signed)
Reason for Disposition  [1] SEVERE diarrhea (e.g., 7 or more times / day more than normal) AND [2] age > 60 years  Answer Assessment - Initial Assessment Questions 1. DIARRHEA SEVERITY: "How bad is the diarrhea?" "How many more stools have you had in the past 24 hours than normal?"    - NO DIARRHEA (SCALE 0)   - MILD (SCALE 1-3): Few loose or mushy BMs; increase of 1-3 stools over normal daily number of stools; mild increase in ostomy output.   -  MODERATE (SCALE 4-7): Increase of 4-6 stools daily over normal; moderate increase in ostomy output. * SEVERE (SCALE 8-10; OR 'WORST POSSIBLE'): Increase of 7 or more stools daily over normal; moderate increase in ostomy output; incontinence.     We had a church get together Sunday.   Monday I haven't felt good.  Each day the diarrhea has gotten worse and now I can't drink or eat anything without it running through me.    2. ONSET: "When did the diarrhea begin?"      Monday and it's gotten worse  Now everything going straight through me.   My husband had the same food but he is fine.   3. BM CONSISTENCY: "How loose or watery is the diarrhea?"      Watery, liquid 4. VOMITING: "Are you also vomiting?" If Yes, ask: "How many times in the past 24 hours?"      No vomiting 5. ABDOMINAL PAIN: "Are you having any abdominal pain?" If Yes, ask: "What does it feel like?" (e.g., crampy, dull, intermittent, constant)      Stomach cramps on and off.   When I try to eat I get cramps.    I'm trying to get fluids in. 6. ABDOMINAL PAIN SEVERITY: If present, ask: "How bad is the pain?"  (e.g., Scale 1-10; mild, moderate, or severe)   - MILD (1-3): doesn't interfere with normal activities, abdomen soft and not tender to touch    - MODERATE (4-7): interferes with normal activities or awakens from sleep, abdomen tender to touch    - SEVERE (8-10): excruciating pain, doubled over, unable to do any normal activities       6-7 then I go to the bathroom and I'm fine. 7. ORAL  INTAKE: If vomiting, "Have you been able to drink liquids?" "How much liquids have you had in the past 24 hours?"     Not able to keep anything in me. 8. HYDRATION: "Any signs of dehydration?" (e.g., dry mouth [not just dry lips], too weak to stand, dizziness, new weight loss) "When did you last urinate?"     No dizziness at all.    I'm just tired yesterday and today.   That's not like me. 9. EXPOSURE: "Have you traveled to a foreign country recently?" "Have you been exposed to anyone with diarrhea?" "Could you have eaten any food that was spoiled?"     Went to a church function Sunday and ate.   Monday I didn't feel good then the diarrhea noticed. 10. ANTIBIOTIC USE: "Are you taking antibiotics now or have you taken antibiotics in the past 2 months?"       No   Just Parkinson's pills. 11. OTHER SYMPTOMS: "Do you have any other symptoms?" (e.g., fever, blood in stool)       No blood 12. PREGNANCY: "Is there any chance you are pregnant?" "When was your last menstrual period?"       N/A due to age  Protocols used:  Diarrhea-A-AH

## 2020-10-12 NOTE — Telephone Encounter (Signed)
Pt called in c/o having diarrhea all week after going to a church function on Sunday.   Monday she didn't feel well and started having diarrhea that has gotten worse over the week.   It's watery and she everything she eats and drinks goes straight through her.  She's tired otherwise no other symptoms.   Her husband is fine and he ate at the function too.    She hasn't heard of anyone else being sick.  I made her an appt for today with Vance Peper, NP for 10:55.    I asked her to be here by 10:40 to be registered.  I sent my notes to [facility].

## 2020-10-12 NOTE — Telephone Encounter (Signed)
Called pt to let her know appt was moved from 10:55 to 10:40 no answer left vm

## 2020-10-12 NOTE — Telephone Encounter (Signed)
FYI scheduled today

## 2020-10-12 NOTE — Progress Notes (Signed)
Acute Office Visit  Subjective:    Patient ID: Cassandra [name], female    DOB: [birth year], 80 y.o.   MRN: [id number]  Chief Complaint  Patient presents with   Diarrhea    For the past week, can not eat or drink anything, runs right through her, patient also states that she is having stomach cramps now, has gotten worse of the past 2 days    HPI Patient is in today diarrhea since Monday. She ate at church event Sunday. She endorses abdominal cramping intermittently. She states that any time she eats or drinks, she has to run to the bathroom. Denies fever, rash, nausea/vomiting, blood in stool, and rash. She has not tried any treatment at home, other than eating small snacks. Her symptoms seem to be getting worse.    Past Medical History:  Diagnosis Date   Cancer ([place])    skin cancer   Gout    Hepatitis    40 years ago   Irritable bowel syndrome    history of....   Nerve damage of left foot     Past Surgical History:  Procedure Laterality Date   ABDOMINAL HYSTERECTOMY     BREAST SURGERY     BIOPSY   CATARACT EXTRACTION W/PHACO Left 11/13/2015   Procedure: CATARACT EXTRACTION PHACO AND INTRAOCULAR LENS PLACEMENT (IOC);  Surgeon: Birder Robson, MD;  Location: [facility] ORS;  Service: Ophthalmology;  Laterality: Left;  [place] 00:31 AP% 11.9 CDE 3.72 Fluid pack  lot # [id number]   CATARACT EXTRACTION W/PHACO Right 12/11/2015   Procedure: CATARACT EXTRACTION PHACO AND INTRAOCULAR LENS PLACEMENT (IOC);  Surgeon: Birder Robson, MD;  Location: [facility] ORS;  Service: Ophthalmology;  Laterality: Right;  Lot# [id number] H [place]: 00:42.4 AP%: 18.9 CDE:8.01    COLONOSCOPY     SKIN CANCER EXCISION     TONSILLECTOMY      Family History  Problem Relation Age of Onset   Kidney Stones Mother    Cancer Father    Cancer Sister        colon and ovarian    Social History   Socioeconomic History   Marital status: Married    Spouse name: Not on file   Number of children: Not on file   Years of  education: Not on file   Highest education level: Not on file  Occupational History   Not on file  Tobacco Use   Smoking status: Never   Smokeless tobacco: Never  Vaping Use   Vaping Use: Never used  Substance and Sexual Activity   Alcohol use: No   Drug use: No   Sexual activity: Not on file  Other Topics Concern   Not on file  Social History Narrative   Not on file   Social Determinants of Health   Financial Resource Strain: Not on file  Food Insecurity: Not on file  Transportation Needs: Not on file  Physical Activity: Not on file  Stress: Not on file  Social Connections: Not on file  Intimate Partner Violence: Not on file    Outpatient Medications Prior to Visit  Medication Sig Dispense Refill   Ferrous Sulfate (SLOW FE) 142 (45 Fe) MG TBCR Take 1 tablet by mouth daily. 30 tablet 7   ipratropium (ATROVENT) 0.03 % nasal spray SMARTSIG:1-2 Spray(s) Both Nares 3 Times Daily PRN     pregabalin (LYRICA) 100 MG capsule Take 100 mg by mouth 2 (two) times daily.     pregabalin (LYRICA) 50 MG capsule Take 50 mg by  mouth in the morning and at bedtime.     rOPINIRole (REQUIP XL) 4 MG 24 hr tablet Take 16 mg by mouth at bedtime.      traZODone (DESYREL) 50 MG tablet Take 0.5-1 tablets (25-50 mg total) by mouth at bedtime as needed for sleep. 30 tablet 3   pregabalin (LYRICA) 100 MG capsule Take 100 mg by mouth 2 (two) times daily.     doxycycline (VIBRA-TABS) 100 MG tablet Take 1 tablet (100 mg total) by mouth 2 (two) times daily. 20 tablet 0   Omega-3 Fatty Acids (FISH OIL PO) Take 1,300 mg by mouth daily. (Patient not taking: Reported on 10/12/2020)     No facility-administered medications prior to visit.    No Known Allergies  Review of Systems  Constitutional:  Positive for fatigue. Negative for fever.  HENT: Negative.    Respiratory: Negative.    Cardiovascular: Negative.   Gastrointestinal:  Positive for abdominal pain (intermittent cramping) and diarrhea.   Genitourinary: Negative.   Musculoskeletal: Negative.   Skin: Negative.   Neurological: Negative.       Objective:    Physical Exam Vitals and nursing note reviewed.  Constitutional:      General: She is not in acute distress.    Appearance: Normal appearance.  HENT:     Head: Normocephalic.  Eyes:     Conjunctiva/sclera: Conjunctivae normal.  Cardiovascular:     Rate and Rhythm: Normal rate and regular rhythm.     Pulses: Normal pulses.     Heart sounds: Normal heart sounds.  Pulmonary:     Effort: Pulmonary effort is normal.     Breath sounds: Normal breath sounds.  Abdominal:     General: Bowel sounds are normal.     Palpations: Abdomen is soft.     Tenderness: There is abdominal tenderness (left lower quadrant).  Musculoskeletal:     Cervical back: Normal range of motion.  Skin:    General: Skin is warm.  Neurological:     General: No focal deficit present.     Mental Status: She is alert and oriented to person, place, and time.  Psychiatric:        Mood and Affect: Mood normal.        Behavior: Behavior normal.        Thought Content: Thought content normal.        Judgment: Judgment normal.    BP 95/63   Pulse (!) 56   Temp 98.2 F (36.8 C) (Oral)   Wt 112 lb (50.8 kg)   SpO2 98%   BMI 19.22 kg/m  Wt Readings from Last 3 Encounters:  10/12/20 112 lb (50.8 kg)  08/13/20 113 lb (51.3 kg)  06/27/20 113 lb 6.4 oz (51.4 kg)    Health Maintenance Due  Topic Date Due   Zoster Vaccines- Shingrix (1 of 2) Never done   COVID-19 Vaccine (3 - Pfizer risk series) 07/19/2019    There are no preventive care reminders to display for this patient.   Lab Results  Component Value Date   TSH 1.820 06/27/2020   Lab Results  Component Value Date   WBC 3.6 06/27/2020   HGB 14.1 06/27/2020   HCT 42.6 06/27/2020   MCV 83 06/27/2020   PLT 271 06/27/2020   Lab Results  Component Value Date   NA 142 06/27/2020   K 4.5 06/27/2020   CO2 22 06/27/2020    GLUCOSE 86 06/27/2020   BUN 16 06/27/2020   CREATININE  0.88 06/27/2020   BILITOT 0.3 06/27/2020   ALKPHOS 71 06/27/2020   AST 33 06/27/2020   ALT 18 06/27/2020   PROT 6.3 06/27/2020   ALBUMIN 4.6 06/27/2020   CALCIUM 9.8 06/27/2020   EGFR 68 06/27/2020   Lab Results  Component Value Date   CHOL 212 (H) 05/10/2018   Lab Results  Component Value Date   HDL 88 05/10/2018   Lab Results  Component Value Date   LDLCALC 111 (H) 05/10/2018   Lab Results  Component Value Date   TRIG 67 05/10/2018   Lab Results  Component Value Date   CHOLHDL 2.4 05/10/2018   Lab Results  Component Value Date   HGBA1C 5.6 06/27/2020       Assessment & Plan:   Problem List Items Addressed This Visit   None Visit Diagnoses     Diarrhea, unspecified type    -  Primary   Will check CMP, CBC, and stool studies for c-diff, O&P, and culture. Try to sip on fluids, eat small bland meals. F/U if symptoms worsen. Treat based on results   Relevant Orders   Basic Metabolic Panel (BMET)   CBC with Differential   Cdiff NAA+O+P+Stool Culture        No orders of the defined types were placed in this encounter.    Charyl Dancer, NP

## 2020-10-13 LAB — CBC WITH DIFFERENTIAL/PLATELET
Basophils Absolute: 0 10*3/uL (ref 0.0–0.2)
Basos: 1 %
EOS (ABSOLUTE): 0.1 10*3/uL (ref 0.0–0.4)
Eos: 2 %
Hematocrit: 45.4 % (ref 34.0–46.6)
Hemoglobin: 14.5 g/dL (ref 11.1–15.9)
Immature Grans (Abs): 0 10*3/uL (ref 0.0–0.1)
Immature Granulocytes: 0 %
Lymphocytes Absolute: 1.5 10*3/uL (ref 0.7–3.1)
Lymphs: 34 %
MCH: 27.7 pg (ref 26.6–33.0)
MCHC: 31.9 g/dL (ref 31.5–35.7)
MCV: 87 fL (ref 79–97)
Monocytes Absolute: 0.3 10*3/uL (ref 0.1–0.9)
Monocytes: 6 %
Neutrophils Absolute: 2.5 10*3/uL (ref 1.4–7.0)
Neutrophils: 57 %
Platelets: 249 10*3/uL (ref 150–450)
RBC: 5.24 x10E6/uL (ref 3.77–5.28)
RDW: 14 % (ref 11.7–15.4)
WBC: 4.3 10*3/uL (ref 3.4–10.8)

## 2020-10-13 LAB — BASIC METABOLIC PANEL
BUN/Creatinine Ratio: 24 (ref 12–28)
BUN: 19 mg/dL (ref 8–27)
CO2: 20 mmol/L (ref 20–29)
Calcium: 9 mg/dL (ref 8.7–10.3)
Chloride: 107 mmol/L — ABNORMAL HIGH (ref 96–106)
Creatinine, Ser: 0.8 mg/dL (ref 0.57–1.00)
Glucose: 82 mg/dL (ref 65–99)
Potassium: 4.3 mmol/L (ref 3.5–5.2)
Sodium: 143 mmol/L (ref 134–144)
eGFR: 75 mL/min/{1.73_m2} (ref >59)

## 2020-10-16 ENCOUNTER — Other Ambulatory Visit: Payer: PPO

## 2020-10-16 ENCOUNTER — Other Ambulatory Visit: Payer: Self-pay

## 2020-10-16 DIAGNOSIS — R197 Diarrhea, unspecified: Secondary | ICD-10-CM | POA: Diagnosis not present

## 2020-10-22 LAB — CDIFF NAA+O+P+STOOL CULTURE
E coli, Shiga toxin Assay: NEGATIVE
Toxigenic C. Difficile by PCR: NEGATIVE

## 2020-10-24 ENCOUNTER — Other Ambulatory Visit: Payer: Self-pay

## 2020-10-24 ENCOUNTER — Other Ambulatory Visit
Admission: RE | Admit: 2020-10-24 | Discharge: 2020-10-24 | Disposition: A | Discharge status: Home / Self Care | Payer: PPO | Source: Ambulatory Visit | Attending: Specialist | Admitting: Specialist

## 2020-10-24 NOTE — Patient Instructions (Signed)
Your procedure is scheduled on: 10/25/20  Report to the Registration Desk on the 1st floor of the [facility]. To find out your arrival time, please call [phone] between 1PM - 3PM on: 10/24/20  REMEMBER: Instructions that are not followed completely may result in serious medical risk, up to and including death; or upon the discretion of your surgeon and anesthesiologist your surgery may need to be rescheduled.  Do not eat food after midnight the night before surgery.  No gum chewing, lozengers or hard candies.  You may however, drink CLEAR liquids up to 2 hours before you are scheduled to arrive for your surgery. Do not drink anything within 2 hours of your scheduled arrival time.  Clear liquids include: - water  - apple juice without pulp - gatorade (not RED, PURPLE, OR BLUE) - black coffee or tea (Do NOT add milk or creamers to the coffee or tea) Do NOT drink anything that is not on this list.  TAKE THESE MEDICATIONS THE MORNING OF SURGERY WITH A SIP OF WATER: NONE  One week prior to surgery: Stop Anti-inflammatories (NSAIDS) such as Advil, Aleve, Ibuprofen, Motrin, Naproxen, Naprosyn and Aspirin based products such as Excedrin, Goodys Powder, BC Powder.  Stop ANY OVER THE COUNTER supplements until after surgery.  You may however, continue to take Tylenol if needed for pain up until the day of surgery.  No Alcohol for 24 hours before or after surgery.  No Smoking including e-cigarettes for 24 hours prior to surgery.  No chewable tobacco products for at least 6 hours prior to surgery.  No nicotine patches on the day of surgery.  Do not use any "recreational" drugs for at least a week prior to your surgery.  Please be advised that the combination of cocaine and anesthesia may have negative outcomes, up to and including death. If you test positive for cocaine, your surgery will be cancelled.  On the morning of surgery brush your teeth with toothpaste and water, you may  rinse your mouth with mouthwash if you wish. Do not swallow any toothpaste or mouthwash.  Do not wear jewelry, make-up, hairpins, clips or nail polish.  Do not wear lotions, powders, or perfumes.   Do not shave body from the neck down 48 hours prior to surgery just in case you cut yourself which could leave a site for infection.  Also, freshly shaved skin may become irritated if using the CHG soap.  Contact lenses, hearing aids and dentures may not be worn into surgery.  Do not bring valuables to the hospital. [facility] is not responsible for any missing/lost belongings or valuables.   Notify your doctor if there is any change in your medical condition (cold, fever, infection).  Wear comfortable clothing (specific to your surgery type) to the hospital.  After surgery, you can help prevent lung complications by doing breathing exercises.  Take deep breaths and cough every 1-2 hours. Your doctor may order a device called an Incentive Spirometer to help you take deep breaths. When coughing or sneezing, hold a pillow firmly against your incision with both hands. This is called "splinting." Doing this helps protect your incision. It also decreases belly discomfort.  If you are being admitted to the hospital overnight, leave your suitcase in the car. After surgery it may be brought to your room.  If you are being discharged the day of surgery, you will not be allowed to drive home. You will need a responsible adult (18 years or older) to  drive you home and stay with you that night.   If you are taking public transportation, you will need to have a responsible adult (18 years or older) with you. Please confirm with your physician that it is acceptable to use public transportation.   Please call the [facility] at [phone] if you have any questions about these instructions.  Surgery Visitation Policy:  Patients undergoing a surgery or procedure may have one family  member or support person with them as long as that person is not COVID-19 positive or experiencing its symptoms.  That person may remain in the waiting area during the procedure.  Inpatient Visitation:    Visiting hours are 7 a.m. to 8 p.m. Inpatients will be allowed two visitors daily. The visitors may change each day during the patient's stay. No visitors under the age of 18. Any visitor under the age of 54 must be accompanied by an adult. The visitor must pass COVID-19 screenings, use hand sanitizer when entering and exiting the patient's room and wear a mask at all times, including in the patient's room. Patients must also wear a mask when staff or their visitor are in the room. Masking is required regardless of vaccination status.

## 2020-10-25 ENCOUNTER — Encounter: Payer: Self-pay | Admitting: Specialist

## 2020-10-25 ENCOUNTER — Other Ambulatory Visit: Payer: Self-pay | Admitting: Specialist

## 2020-10-25 ENCOUNTER — Other Ambulatory Visit: Payer: Self-pay

## 2020-10-25 ENCOUNTER — Encounter
Admission: RE | Disposition: A | Discharge status: Home / Self Care | Payer: Self-pay | Source: Home / Self Care | Attending: Specialist

## 2020-10-25 ENCOUNTER — Ambulatory Visit
Admission: RE | Admit: 2020-10-25 | Discharge: 2020-10-25 | Disposition: A | Discharge status: Home / Self Care | Payer: PPO | Attending: Specialist | Admitting: Specialist

## 2020-10-25 ENCOUNTER — Ambulatory Visit: Payer: PPO | Admitting: Anesthesiology

## 2020-10-25 ENCOUNTER — Ambulatory Visit: Payer: PPO

## 2020-10-25 DIAGNOSIS — Z800 Family history of malignant neoplasm of digestive organs: Secondary | ICD-10-CM | POA: Insufficient documentation

## 2020-10-25 DIAGNOSIS — Z01810 Encounter for preprocedural cardiovascular examination: Secondary | ICD-10-CM | POA: Diagnosis not present

## 2020-10-25 DIAGNOSIS — Z8041 Family history of malignant neoplasm of ovary: Secondary | ICD-10-CM | POA: Diagnosis not present

## 2020-10-25 DIAGNOSIS — M2012 Hallux valgus (acquired), left foot: Secondary | ICD-10-CM | POA: Insufficient documentation

## 2020-10-25 DIAGNOSIS — Z791 Long term (current) use of non-steroidal anti-inflammatories (NSAID): Secondary | ICD-10-CM | POA: Insufficient documentation

## 2020-10-25 DIAGNOSIS — Z841 Family history of disorders of kidney and ureter: Secondary | ICD-10-CM | POA: Diagnosis not present

## 2020-10-25 DIAGNOSIS — Z79899 Other long term (current) drug therapy: Secondary | ICD-10-CM | POA: Diagnosis not present

## 2020-10-25 DIAGNOSIS — M2011 Hallux valgus (acquired), right foot: Secondary | ICD-10-CM | POA: Insufficient documentation

## 2020-10-25 HISTORY — PX: BUNIONECTOMY: SHX129

## 2020-10-25 SURGERY — BUNIONECTOMY
Anesthesia: General | Site: Toe | Laterality: Bilateral

## 2020-10-25 MED ORDER — CEFAZOLIN SODIUM-DEXTROSE 2-4 GM/100ML-% IV SOLN
INTRAVENOUS | Status: AC
  Filled 2020-10-25: qty 100

## 2020-10-25 MED ORDER — NEOMYCIN-POLYMYXIN B GU 40-200000 IR SOLN
Status: AC
  Filled 2020-10-25: qty 1

## 2020-10-25 MED ORDER — DEXAMETHASONE SODIUM PHOSPHATE 10 MG/ML IJ SOLN
INTRAMUSCULAR | Status: DC | PRN
  Administered 2020-10-25: 5 mg via INTRAVENOUS

## 2020-10-25 MED ORDER — HYDROCODONE-ACETAMINOPHEN 5-325 MG PO TABS: 1.0000 | ORAL_TABLET | Freq: Four times a day (QID) | ORAL | 0 refills | Status: DC | PRN

## 2020-10-25 MED ORDER — GABAPENTIN 400 MG PO CAPS: 400.0000 mg | ORAL_CAPSULE | Freq: Three times a day (TID) | ORAL | 3 refills | Status: DC

## 2020-10-25 MED ORDER — PROPOFOL 10 MG/ML IV BOLUS
INTRAVENOUS | Status: AC
  Filled 2020-10-25: qty 20

## 2020-10-25 MED ORDER — CHLORHEXIDINE GLUCONATE 0.12 % MT SOLN: 15.0000 mL | Freq: Once | OROMUCOSAL | Status: AC

## 2020-10-25 MED ORDER — EPHEDRINE SULFATE 50 MG/ML IJ SOLN
INTRAMUSCULAR | Status: DC | PRN
  Administered 2020-10-25: 5 mg via INTRAVENOUS
  Administered 2020-10-25: 10 mg via INTRAVENOUS
  Administered 2020-10-25: 5 mg via INTRAVENOUS

## 2020-10-25 MED ORDER — ONDANSETRON HCL 4 MG/2ML IJ SOLN: 4.0000 mg | Freq: Once | INTRAMUSCULAR | Status: DC | PRN

## 2020-10-25 MED ORDER — GLYCOPYRROLATE 0.2 MG/ML IJ SOLN
INTRAMUSCULAR | Status: AC
  Filled 2020-10-25: qty 1

## 2020-10-25 MED ORDER — CLINDAMYCIN PHOSPHATE 600 MG/50ML IV SOLN: 600.0000 mg | Freq: Once | INTRAVENOUS | Status: DC

## 2020-10-25 MED ORDER — SODIUM CHLORIDE 0.9 % IR SOLN: Status: DC | PRN

## 2020-10-25 MED ORDER — MELOXICAM 15 MG PO TABS: 15.0000 mg | ORAL_TABLET | Freq: Every day | ORAL | 3 refills | Status: DC

## 2020-10-25 MED ORDER — FAMOTIDINE 20 MG PO TABS: 20.0000 mg | ORAL_TABLET | Freq: Once | ORAL | Status: AC

## 2020-10-25 MED ORDER — CHLORHEXIDINE GLUCONATE 0.12 % MT SOLN
OROMUCOSAL | Status: AC
  Administered 2020-10-25: 15 mL via OROMUCOSAL
  Filled 2020-10-25: qty 15

## 2020-10-25 MED ORDER — ACETAMINOPHEN 10 MG/ML IV SOLN
INTRAVENOUS | Status: AC
  Filled 2020-10-25: qty 100

## 2020-10-25 MED ORDER — 0.9 % SODIUM CHLORIDE (POUR BTL) OPTIME
TOPICAL | Status: DC | PRN
  Administered 2020-10-25: 50 mL

## 2020-10-25 MED ORDER — ONDANSETRON HCL 4 MG/2ML IJ SOLN
INTRAMUSCULAR | Status: AC
  Filled 2020-10-25: qty 2

## 2020-10-25 MED ORDER — CEFAZOLIN SODIUM-DEXTROSE 2-4 GM/100ML-% IV SOLN
2.0000 g | INTRAVENOUS | Status: AC
  Administered 2020-10-25: 2 g via INTRAVENOUS

## 2020-10-25 MED ORDER — ONDANSETRON HCL 4 MG/2ML IJ SOLN
INTRAMUSCULAR | Status: DC | PRN
  Administered 2020-10-25: 4 mg via INTRAVENOUS

## 2020-10-25 MED ORDER — LIDOCAINE HCL (PF) 1 % IJ SOLN
INTRAMUSCULAR | Status: AC
  Filled 2020-10-25: qty 30

## 2020-10-25 MED ORDER — ACETAMINOPHEN 10 MG/ML IV SOLN
INTRAVENOUS | Status: DC | PRN
  Administered 2020-10-25: 750 mg via INTRAVENOUS

## 2020-10-25 MED ORDER — FENTANYL CITRATE (PF) 100 MCG/2ML IJ SOLN
INTRAMUSCULAR | Status: DC | PRN
  Administered 2020-10-25: 50 ug via INTRAVENOUS
  Administered 2020-10-25: 25 ug via INTRAVENOUS
  Administered 2020-10-25: 50 ug via INTRAVENOUS

## 2020-10-25 MED ORDER — CHLORHEXIDINE GLUCONATE CLOTH 2 % EX PADS: 6.0000 | MEDICATED_PAD | Freq: Once | CUTANEOUS | Status: DC

## 2020-10-25 MED ORDER — HYDROCODONE-ACETAMINOPHEN 5-325 MG PO TABS
ORAL_TABLET | ORAL | Status: AC
  Filled 2020-10-25: qty 1

## 2020-10-25 MED ORDER — LACTATED RINGERS IV SOLN: INTRAVENOUS | Status: DC

## 2020-10-25 MED ORDER — ORAL CARE MOUTH RINSE: 15.0000 mL | Freq: Once | OROMUCOSAL | Status: AC

## 2020-10-25 MED ORDER — DEXAMETHASONE SODIUM PHOSPHATE 10 MG/ML IJ SOLN
INTRAMUSCULAR | Status: AC
  Filled 2020-10-25: qty 1

## 2020-10-25 MED ORDER — FENTANYL CITRATE (PF) 100 MCG/2ML IJ SOLN: 25.0000 ug | INTRAMUSCULAR | Status: DC | PRN

## 2020-10-25 MED ORDER — FAMOTIDINE 20 MG PO TABS
ORAL_TABLET | ORAL | Status: AC
  Administered 2020-10-25: 20 mg via ORAL
  Filled 2020-10-25: qty 1

## 2020-10-25 MED ORDER — LIDOCAINE HCL (PF) 2 % IJ SOLN
INTRAMUSCULAR | Status: AC
  Filled 2020-10-25: qty 5

## 2020-10-25 MED ORDER — BUPIVACAINE HCL (PF) 0.5 % IJ SOLN
INTRAMUSCULAR | Status: DC | PRN
  Administered 2020-10-25: 30 mL

## 2020-10-25 MED ORDER — CLINDAMYCIN PHOSPHATE 600 MG/50ML IV SOLN
600.0000 mg | INTRAVENOUS | Status: AC
  Administered 2020-10-25: 600 mg via INTRAVENOUS

## 2020-10-25 MED ORDER — GABAPENTIN 300 MG PO CAPS
ORAL_CAPSULE | ORAL | Status: AC
  Filled 2020-10-25: qty 1

## 2020-10-25 MED ORDER — FENTANYL CITRATE (PF) 100 MCG/2ML IJ SOLN
INTRAMUSCULAR | Status: AC
  Filled 2020-10-25: qty 2

## 2020-10-25 MED ORDER — CLINDAMYCIN PHOSPHATE 600 MG/50ML IV SOLN
INTRAVENOUS | Status: AC
  Filled 2020-10-25: qty 50

## 2020-10-25 MED ORDER — PROPOFOL 10 MG/ML IV BOLUS
INTRAVENOUS | Status: DC | PRN
  Administered 2020-10-25: 150 mg via INTRAVENOUS

## 2020-10-25 MED ORDER — LIDOCAINE HCL (CARDIAC) PF 100 MG/5ML IV SOSY
PREFILLED_SYRINGE | INTRAVENOUS | Status: DC | PRN
  Administered 2020-10-25: 80 mg via INTRAVENOUS

## 2020-10-25 MED ORDER — BUPIVACAINE HCL (PF) 0.5 % IJ SOLN
INTRAMUSCULAR | Status: AC
  Filled 2020-10-25: qty 30

## 2020-10-25 MED ORDER — GABAPENTIN 300 MG PO CAPS
300.0000 mg | ORAL_CAPSULE | ORAL | Status: AC
  Administered 2020-10-25: 300 mg via ORAL

## 2020-10-25 MED ORDER — GLYCOPYRROLATE 0.2 MG/ML IJ SOLN
INTRAMUSCULAR | Status: DC | PRN
  Administered 2020-10-25: .2 mg via INTRAVENOUS

## 2020-10-25 MED ORDER — HYDROCODONE-ACETAMINOPHEN 5-325 MG PO TABS
1.0000 | ORAL_TABLET | Freq: Once | ORAL | Status: AC
  Administered 2020-10-25: 1 via ORAL

## 2020-10-25 SURGICAL SUPPLY — 49 items
APL PRP STRL LF DISP 70% ISPRP (MISCELLANEOUS) ×2
BLADE CRESCENTIC (BLADE) IMPLANT
BLADE OSC/SAGITTAL MD 9X18.5 (BLADE) ×2 IMPLANT
BLADE SURG 15 STRL LF DISP TIS (BLADE) ×3 IMPLANT
BLADE SURG 15 STRL SS (BLADE) ×6
BLADE SURG MINI STRL (BLADE) ×2 IMPLANT
BNDG ESMARK 6X12 TAN STRL LF (GAUZE/BANDAGES/DRESSINGS) ×2 IMPLANT
C-WIRE .045" SPADE ×4 IMPLANT
C-WIRE .062" SPADE ×2 IMPLANT
CANISTER SUCT 1200ML W/VALVE (MISCELLANEOUS) ×2 IMPLANT
CHLORAPREP W/TINT 26 (MISCELLANEOUS) ×4 IMPLANT
CUFF TOURN SGL QUICK 18X4 (TOURNIQUET CUFF) IMPLANT
CUFF TOURN SGL QUICK 24 (TOURNIQUET CUFF)
CUFF TRNQT CYL 24X4X16.5-23 (TOURNIQUET CUFF) IMPLANT
DRAPE EXTREMITY 106X87X128.5 (DRAPES) ×2 IMPLANT
DRAPE FLUOR MINI C-ARM 54X84 (DRAPES) IMPLANT
DRSG GAUZE FLUFF 36X18 (GAUZE/BANDAGES/DRESSINGS) IMPLANT
ELECT REM PT RETURN 9FT ADLT (ELECTROSURGICAL) ×2
ELECTRODE REM PT RTRN 9FT ADLT (ELECTROSURGICAL) ×1 IMPLANT
GAUZE 4X4 16PLY ~~LOC~~+RFID DBL (SPONGE) IMPLANT
GAUZE SPONGE 4X4 12PLY STRL (GAUZE/BANDAGES/DRESSINGS) ×4 IMPLANT
GAUZE XEROFORM 1X8 LF (GAUZE/BANDAGES/DRESSINGS) ×4 IMPLANT
GLOVE SURG ENC MOIS LTX SZ8 (GLOVE) ×2 IMPLANT
GLOVE SURG ORTHO LTX SZ8.5 (GLOVE) ×2 IMPLANT
GLOVE SURG UNDER LTX SZ8 (GLOVE) ×2 IMPLANT
GOWN STRL REUS W/ TWL LRG LVL3 (GOWN DISPOSABLE) ×1 IMPLANT
GOWN STRL REUS W/TWL LRG LVL3 (GOWN DISPOSABLE) ×2
GOWN STRL REUS W/TWL LRG LVL4 (GOWN DISPOSABLE) ×2 IMPLANT
KIT TURNOVER KIT A (KITS) ×2 IMPLANT
MANIFOLD NEPTUNE II (INSTRUMENTS) ×2 IMPLANT
NS IRRIG 1000ML POUR BTL (IV SOLUTION) ×2 IMPLANT
PACK EXTREMITY ARMC (MISCELLANEOUS) ×2 IMPLANT
PAD CAST CTTN 4X4 STRL (SOFTGOODS) ×2 IMPLANT
PADDING CAST COTTON 4X4 STRL (SOFTGOODS) ×4
SPLINT CAST 1 STEP 5X30 WHT (MISCELLANEOUS) IMPLANT
STAPLER SKIN PROX 35W (STAPLE) ×2 IMPLANT
STOCKINETTE BIAS CUT 4 980044 (GAUZE/BANDAGES/DRESSINGS) ×4 IMPLANT
STOCKINETTE STRL 6IN 960660 (GAUZE/BANDAGES/DRESSINGS) ×4 IMPLANT
SUT BONE WAX W31G (SUTURE) ×2 IMPLANT
SUT MERSILENE 2.0 SH NDLE (SUTURE) ×4 IMPLANT
SUT PROLENE 2 0 FS (SUTURE) ×4 IMPLANT
SUT VIC AB 2-0 SH 27 (SUTURE) ×2
SUT VIC AB 2-0 SH 27XBRD (SUTURE) ×1 IMPLANT
SUT VIC AB 4-0 SH 27 (SUTURE) ×2
SUT VIC AB 4-0 SH 27XANBCTRL (SUTURE) ×1 IMPLANT
SYR 10ML LL (SYRINGE) ×2 IMPLANT
WIRE Z .035 C-WIRE SPADE TIP (WIRE) IMPLANT
WIRE Z .045 C-WIRE SPADE TIP (WIRE) ×4 IMPLANT
WIRE Z .062 C-WIRE SPADE TIP (WIRE) ×4 IMPLANT

## 2020-10-25 NOTE — H&P (Signed)
THE PATIENT WAS SEEN PRIOR TO SURGERY TODAY.  HISTORY, ALLERGIES, HOME MEDICATIONS AND OPERATIVE PROCEDURE WERE REVIEWED. RISKS AND BENEFITS OF SURGERY DISCUSSED WITH PATIENT AGAIN.  NO CHANGES FROM INITIAL HISTORY AND PHYSICAL NOTED.    

## 2020-10-25 NOTE — H&P (Signed)
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 These bother her greatly and she cannot wear many shoes.Symptoms are rated as moderate to severe, and have been worsening.  This is significantly impairing activities of daily living.  She has elected for surgical management.   Past Medical History:  Diagnosis Date   Cancer ([place])    skin cancer   Gout    Hepatitis    40 years ago   Irritable bowel syndrome    history of....   Nerve damage of left foot    Past Surgical History:  Procedure Laterality Date   ABDOMINAL HYSTERECTOMY     BREAST SURGERY     BIOPSY   CATARACT EXTRACTION W/PHACO Left 11/13/2015   Procedure: CATARACT EXTRACTION PHACO AND INTRAOCULAR LENS PLACEMENT (IOC);  Surgeon: Birder Robson, MD;  Location: [facility] ORS;  Service: Ophthalmology;  Laterality: Left;  [place] 00:31 AP% 11.9 CDE 3.72 Fluid pack  lot # [id number]   CATARACT EXTRACTION W/PHACO Right 12/11/2015   Procedure: CATARACT EXTRACTION PHACO AND INTRAOCULAR LENS PLACEMENT (IOC);  Surgeon: Birder Robson, MD;  Location: [facility] ORS;  Service: Ophthalmology;  Laterality: Right;  Lot# [id number] H [place]: 00:42.4 AP%: 18.9 CDE:8.01    COLONOSCOPY     SKIN CANCER EXCISION     TONSILLECTOMY     Social History   Socioeconomic History   Marital status: Married    Spouse name: Not on file   Number of children: Not on file   Years of education: Not on file   Highest education level: Not on file  Occupational History   Not on file  Tobacco Use   Smoking status: Never   Smokeless tobacco: Never  Vaping Use   Vaping Use: Never used  Substance and Sexual Activity   Alcohol use: No   Drug use: No   Sexual activity: Not on file  Other Topics Concern    Not on file  Social History Narrative   Not on file   Social Determinants of Health   Financial Resource Strain: Not on file  Food Insecurity: Not on file  Transportation Needs: Not on file  Physical Activity: Not on file  Stress: Not on file  Social Connections: Not on file   Family History  Problem Relation Age of Onset   Kidney Stones Mother    Cancer Father    Cancer Sister        colon and ovarian   No Known Allergies Prior to Admission medications   Medication Sig Start Date End Date Taking? Authorizing Provider  Carboxymethylcellul-Glycerin (LUBRICATING EYE DROPS OP) Place 1 drop into both eyes daily as needed (dry eyes).   Yes [provider]  ferrous sulfate 325 (65 FE) MG tablet Take 325 mg by mouth every other day.   Yes [provider]  ibuprofen (ADVIL) 200 MG tablet Take 200 mg by mouth every 6 (six) hours as needed for headache or moderate pain.   Yes [provider]  ipratropium (ATROVENT) 0.03 % nasal spray Place 1 spray into both nostrils daily as needed for rhinitis. 07/21/20  Yes [provider]  pregabalin (LYRICA) 50 MG capsule Take 100-150 mg by mouth at bedtime.   Yes [provider]  vitamin B-12 (CYANOCOBALAMIN) 1000 MCG tablet  Take 1,000 mcg by mouth every other day.   Yes [provider]     Positive ROS: All other systems have been reviewed and were otherwise negative with the exception of those mentioned in the HPI and as above.  Physical Exam: General: Alert, no acute distress Cardiovascular: No pedal edema. Heart is regular and without murmur.  Respiratory: No cyanosis, no use of accessory musculature. Lungs are clear. GI: No organomegaly, abdomen is soft and non-tender Skin: No lesions in the area of chief complaint Neurologic: Sensation intact distally Psychiatric: Patient is competent for consent with normal mood and affect Lymphatic: No axillary or cervical  lymphadenopathy  MUSCULOSKELETAL: Alert and oriented.  Both feet show significant hallux valgus and large bunions.  She is tender to touch and has pain with movement of the great toes. NV normal.  Skin intact.   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  Will do distal metatarsal osteotomies.   The risks benefits and alternatives were discussed with the patient including but not limited to the risks of nonoperative treatment, versus surgical intervention including infection, bleeding, nerve injury,  blood clots, cardiopulmonary complications, morbidity, mortality, among others, and they were willing to proceed.   Park Breed, MD [phone]   10/25/2020 7:34 AM

## 2020-10-25 NOTE — Anesthesia Procedure Notes (Signed)
Procedure Name: LMA Insertion Date/Time: 10/25/2020 7:53 AM Performed by: Aline Brochure, CRNA Pre-anesthesia Checklist: Patient identified, Patient being monitored, Timeout performed, Emergency Drugs available and Suction available Patient Re-evaluated:Patient Re-evaluated prior to induction Oxygen Delivery Method: Circle system utilized Preoxygenation: Pre-oxygenation with 100% oxygen Induction Type: IV induction Ventilation: Mask ventilation without difficulty LMA: LMA inserted LMA Size: 3.5 Tube type: Oral Number of attempts: 1 Placement Confirmation: positive ETCO2 and breath sounds checked- equal and bilateral Tube secured with: Tape Dental Injury: Teeth and Oropharynx as per pre-operative assessment

## 2020-10-25 NOTE — Progress Notes (Signed)
Informed Dr. Randa Lynn HR running low 40s.  BP 119/60.  Pt states "I feel fine."  No new orders from Provider.  I

## 2020-10-25 NOTE — Anesthesia Preprocedure Evaluation (Signed)
Anesthesia Evaluation  Patient identified by MRN, date of birth, ID band Patient awake    Reviewed: Allergy & Precautions, NPO status , Patient's Chart, lab work & pertinent test results  History of Anesthesia Complications Negative for: history of anesthetic complications  Airway Mallampati: II       Dental   Pulmonary neg pulmonary ROS,           Cardiovascular negative cardio ROS       Neuro/Psych negative neurological ROS     GI/Hepatic negative GI ROS, (+) Hepatitis -  Endo/Other  negative endocrine ROS  Renal/GU negative Renal ROS     Musculoskeletal   Abdominal   Peds  Hematology negative hematology ROS (+)   Anesthesia Other Findings Cancer ([place])  skin cancer  Gout    Hepatitis  40 years ago  Irritable bowel syndrome  history of....  Nerve damage of left foot       Reproductive/Obstetrics                             Anesthesia Physical  Anesthesia Plan  ASA: 2  Anesthesia Plan: General   Post-op Pain Management:    Induction: Intravenous  PONV Risk Score and Plan: 2 and Propofol infusion and Midazolam  Airway Management Planned: LMA  Additional Equipment:   Intra-op Plan:   Post-operative Plan:   Informed Consent: I have reviewed the patients History and Physical, chart, labs and discussed the procedure including the risks, benefits and alternatives for the proposed anesthesia with the patient or authorized representative who has indicated his/her understanding and acceptance.       Plan Discussed with: CRNA, Anesthesiologist and Surgeon  Anesthesia Plan Comments:        Anesthesia Quick Evaluation

## 2020-10-25 NOTE — Op Note (Signed)
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    The risks benefits and alternatives were discussed with the patient preoperatively including but not limited to the risks of infection, bleeding, nerve injury, cardiopulmonary complications, the need for revision surgery, among others, and the patient was willing to proceed.  OPERATIVE IMPLANTS: 4 K wires  OPERATIVE FINDINGS: There was significant bunion formation and hallux valgus deformity.    EBL: None  TOURNIQUET TIME: 64   MIN left, 58 minutes right  COMPLICATIONS:   None  OPERATIVE PROCEDURE: The patient was brought to the operating room and underwent general LMA anesthesia in the supine position.  The left operative foot was prepped and draped in a sterile fashion and an Esmarch applied.  Tourniquet was inflated to 300 mmHg.  A medial incision was made over the first MP joint and dissection carried out bluntly through subcutaneous tissue.  Dorsal nerve was carefully protected and elevated out of the field.  The medial capsule was divided vertically and the joint opened.  Soft tissue was peeled off the bunion.  An oscillating saw was used to remove the prominent bunion.  Rongeur was used to smooth the bone edges off.  The joint was distracted and mini blade knife used to perforate the lateral capsule.  Centering hole was then made in the medial metatarsal head region and oscillating saw used to create the chevron osteotomy.  Small  osteotomes were used to free the distal fragment up from the shaft of the bone.  The osteotomy fragment was pushed laterally and then pinned in place with a 0.045 K wire from dorsal to volar.  Fluoroscopy showed the pin to be in good position.  It was bent over, cut and buried.  After irrigation, the MP joint was placed in neutral and a 0.062 K wire placed across the joint to stabilize it.  This was also cut and buried.  The medial capsule was then repaired with 2-0 Mersilene suture.  Final fluoroscopy showed excellent position of the pins and the joint and osteotomy.  Subcutaneous tissue was closed with 4-0 Vicryl and the skin was closed with staples.  Half percent Marcaine was placed in the wounds.  Dry sterile padded dressing was applied.  Tourniquet was deflated with good return of blood flow to the foot.  This foot was then covered with sterile drapes and the similar procedure was then carried out on the right foot in identical fashion.  Once both procedures were pursued were complete fluoroscopy was brought in and permanent films were taken.  These showed good position of the osteotomies and the joint alignment and the pins.  Bilateral postop shoes were then applied.  At this point the patient was awakened and taken to recovery in good condition.  Park Breed, M.D.

## 2020-10-25 NOTE — Anesthesia Postprocedure Evaluation (Signed)
Anesthesia Post Note  Patient: Cassandra [name]  Procedure(s) Performed: Lillard Anes (Bilateral: Toe)  Patient location during evaluation: PACU Anesthesia Type: General Level of consciousness: awake and alert, awake and oriented Pain management: pain level controlled Vital Signs Assessment: post-procedure vital signs reviewed and stable Respiratory status: spontaneous breathing, nonlabored ventilation and respiratory function stable Cardiovascular status: blood pressure returned to baseline and stable Postop Assessment: no apparent nausea or vomiting Anesthetic complications: no   No notable events documented.   Last Vitals:  Vitals:   10/25/20 1135 10/25/20 1147  BP:  (!) 123/57  Pulse: 88 (!) 42  Resp: 20 14  Temp:  (!) 36.1 C  SpO2: 95% 100%    Last Pain:  Vitals:   10/25/20 1147  TempSrc: Temporal  PainSc: 5                  Phill Mutter

## 2020-10-25 NOTE — Discharge Instructions (Signed)

## 2020-10-25 NOTE — Transfer of Care (Signed)
Immediate Anesthesia Transfer of Care Note  Patient: Cassandra [name]  Procedure(s) Performed: Lillard Anes (Bilateral: Toe)  Patient Location: PACU  Anesthesia Type:General  Level of Consciousness: sedated  Airway & Oxygen Therapy: Patient Spontanous Breathing and Patient connected to face mask oxygen  Post-op Assessment: Report given to RN and Post -op Vital signs reviewed and stable  Post vital signs: Reviewed and stable  Last Vitals:  Vitals Value Taken Time  BP 120/69   Temp    Pulse 71   Resp 9   SpO2 100     Last Pain:  Vitals:   10/25/20 0637  TempSrc: Oral         Complications: No notable events documented.

## 2020-10-26 IMAGING — CR LEFT FOOT - COMPLETE 3+ VIEW
1 series · 3 of 3 positions shown · non-contrast
Comparison: None.

CLINICAL DATA: Recent soft tissue injury with pain and swelling,
initial encounter

EXAM:
LEFT FOOT - COMPLETE 3+ VIEW

[Series 1: dg foot complete left · 0.14mm/px · 3 of 3 slices shown]
[im 1/3]
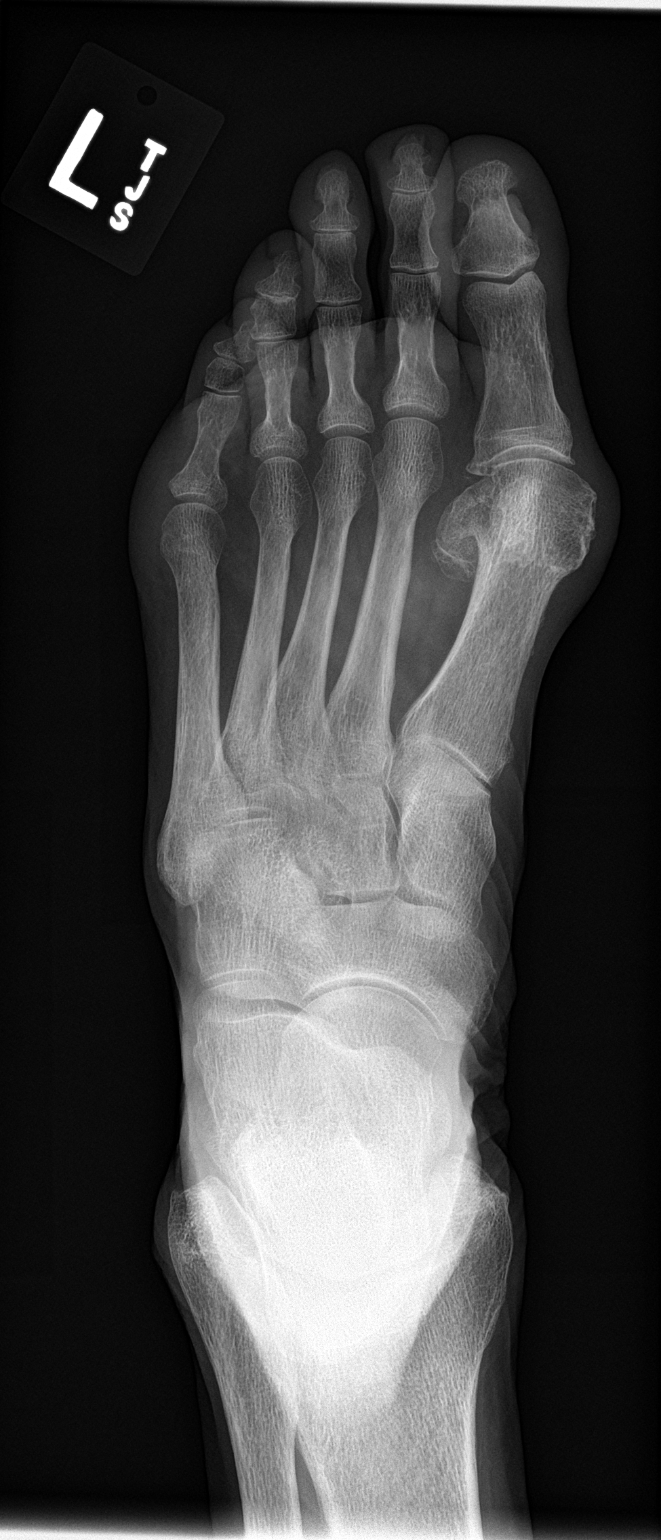
[im 2/3]
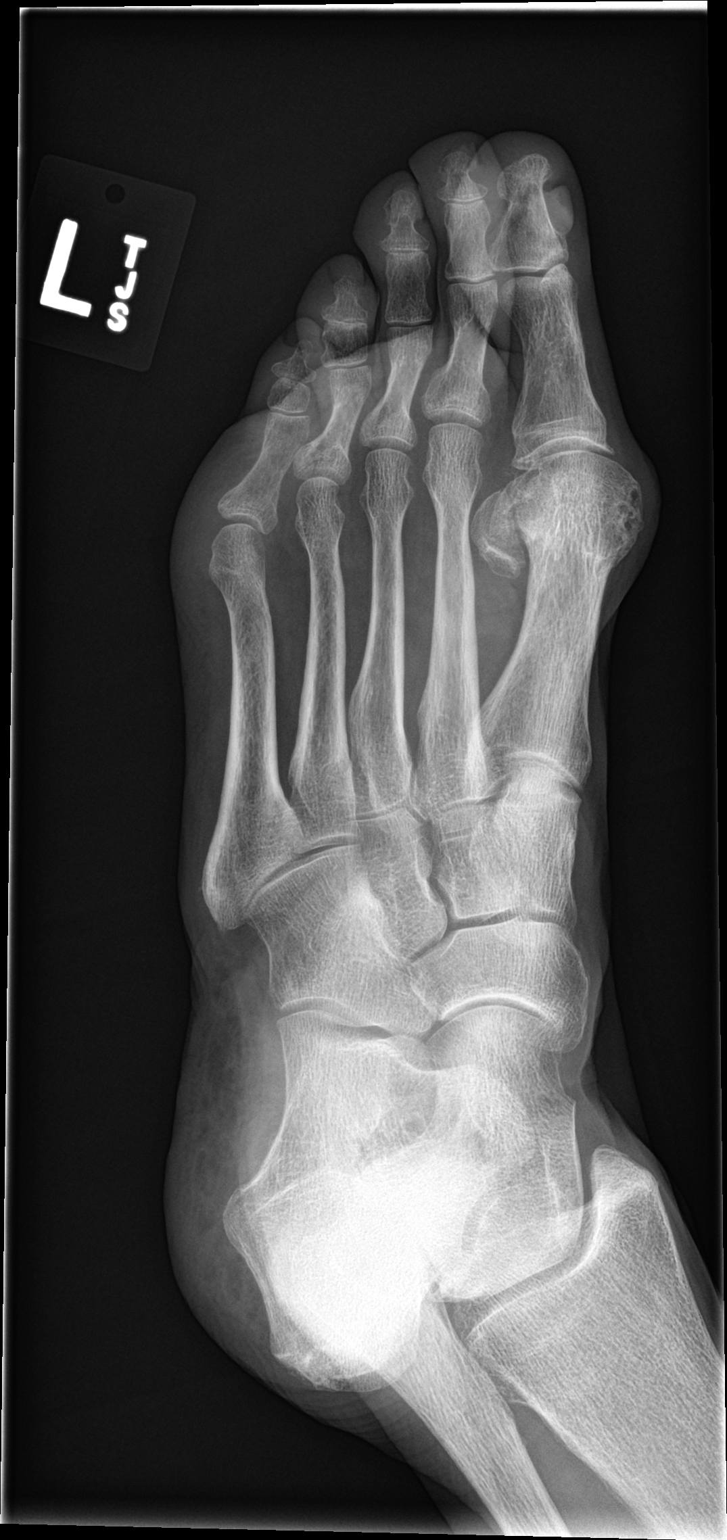
[im 3/3]
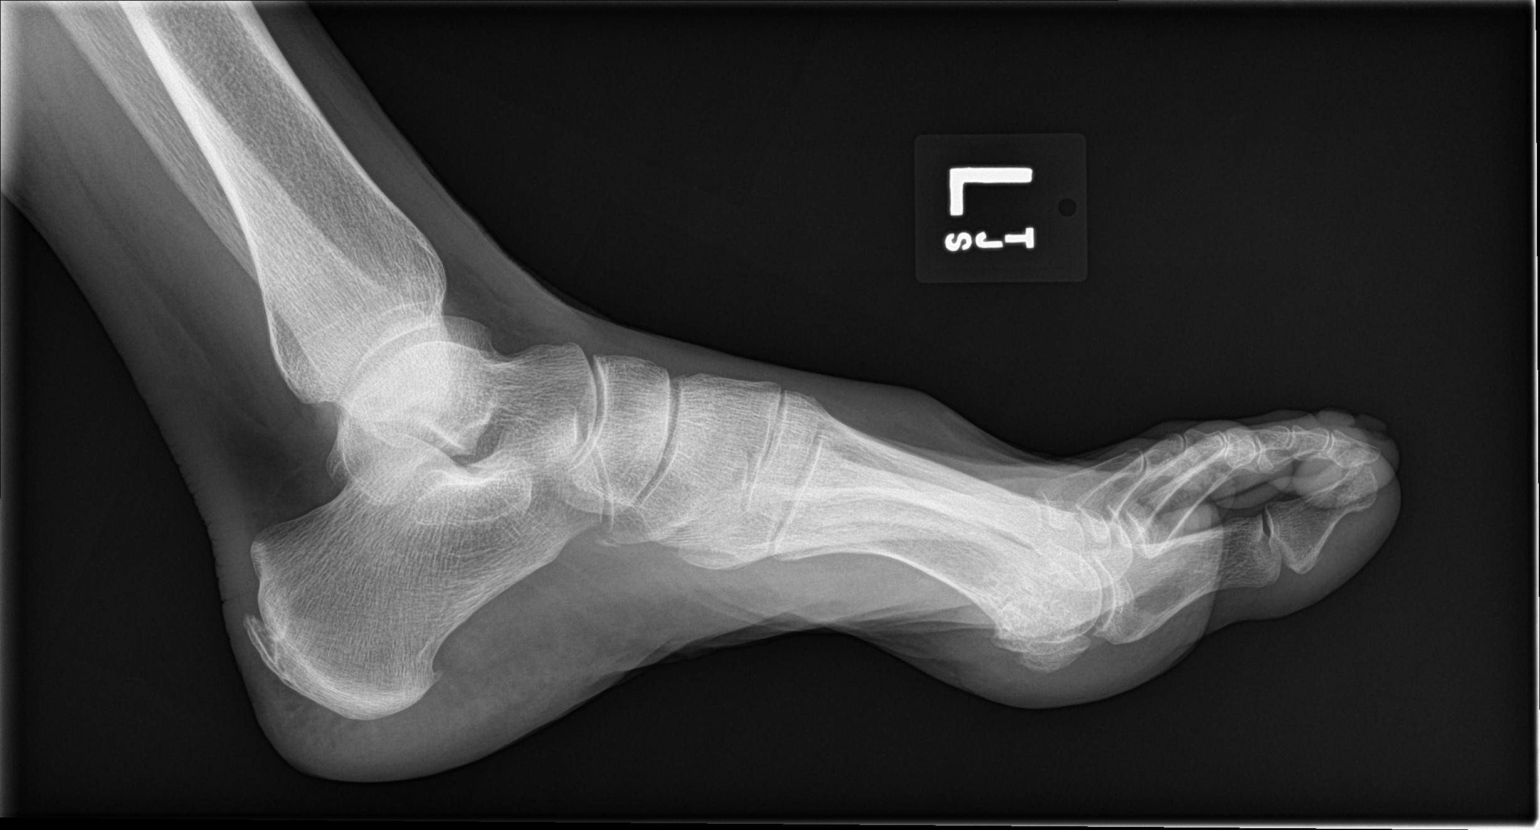

[3 of 3 positions shown; findings below may reference images not displayed]

FINDINGS: Mild hallux valgus deformity is noted. No acute fracture or
dislocation is seen. No radiopaque foreign body is noted. Soft
tissue swelling is noted dorsally consistent with the recent injury.
IMPRESSION: Soft tissue swelling without acute bony abnormality.

## 2020-10-29 DIAGNOSIS — M2012 Hallux valgus (acquired), left foot: Secondary | ICD-10-CM | POA: Diagnosis not present

## 2020-10-29 DIAGNOSIS — M2011 Hallux valgus (acquired), right foot: Secondary | ICD-10-CM | POA: Diagnosis not present

## 2020-10-29 DIAGNOSIS — Z4889 Encounter for other specified surgical aftercare: Secondary | ICD-10-CM | POA: Diagnosis not present

## 2020-12-06 DIAGNOSIS — M2012 Hallux valgus (acquired), left foot: Secondary | ICD-10-CM | POA: Diagnosis not present

## 2020-12-06 DIAGNOSIS — M2011 Hallux valgus (acquired), right foot: Secondary | ICD-10-CM | POA: Diagnosis not present

## 2020-12-08 IMAGING — MR MRI HEAD WITHOUT CONTRAST
16 of 17 series · 43 of 48 positions shown · non-contrast
Comparison: None.

CLINICAL DATA: Imbalance.  Left leg numbness.

EXAM:
MRI HEAD WITHOUT CONTRAST
MRI CERVICAL SPINE WITHOUT CONTRAST
TECHNIQUE: Multiplanar, multiecho pulse sequences of the brain and surrounding
structures, and cervical spine, to include the craniocervical
junction and cervicothoracic junction, were obtained without
intravenous contrast.

[Series 5: ax dwi_tracew · axial · 3.0mm · 0.60mm/px · z∈[-87,+78]mm · 3 of 57 slices shown]
[im 1/57]
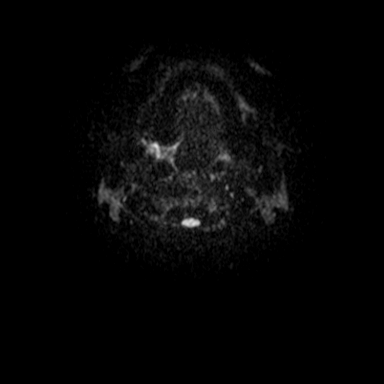
[im 29/57]
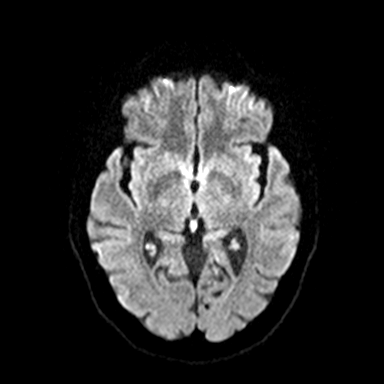
[im 57/57]
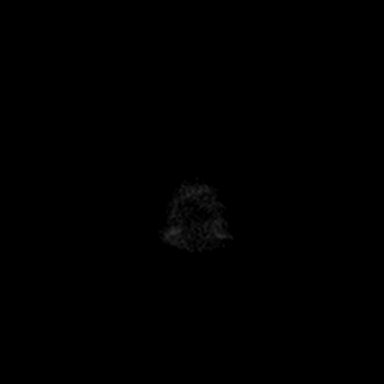

[Series 6: ax dwi_adc · axial · 3.0mm · 0.60mm/px · z∈[-87,+78]mm · 3 of 57 slices shown]
[im 1/57]
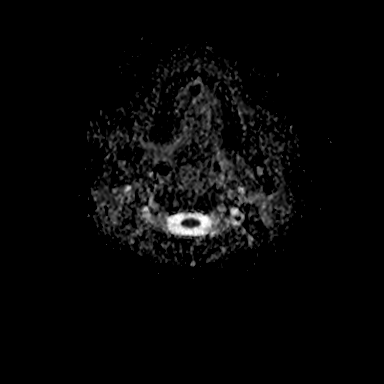
[im 29/57]
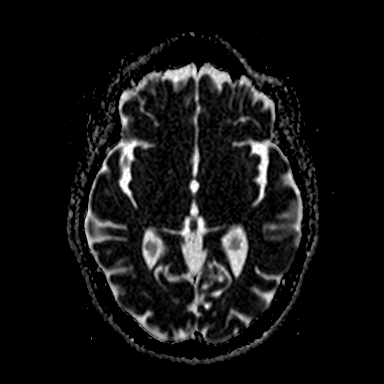
[im 57/57]
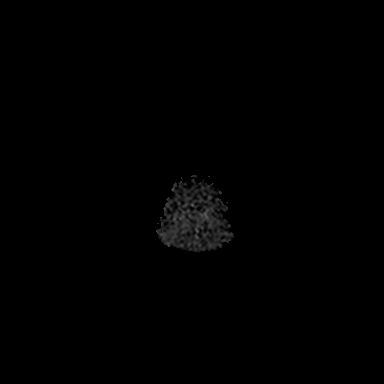

[Series 7: cor dwi_tracew · coronal · 5.0mm · 0.60mm/px · 2 of 40 slices shown]
[im 1/40]
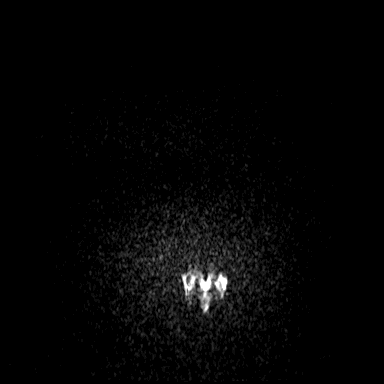
[im 40/40]
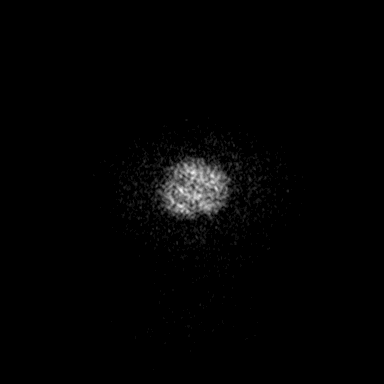

[Series 8: cor dwi_adc · coronal · 5.0mm · 0.60mm/px · 2 of 40 slices shown]
[im 1/40]
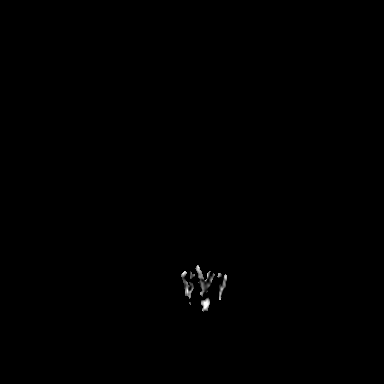
[im 40/40]
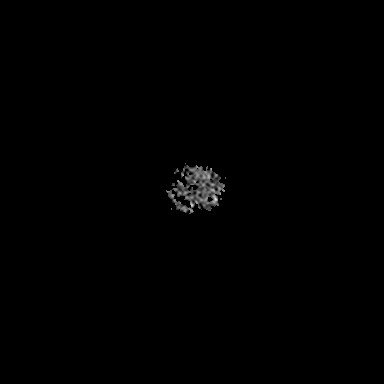

[Series 9: T1 · sagittal · 5.0mm · 0.62mm/px · 1 of 22 slices shown (1 of 2)]
[im 1/22]
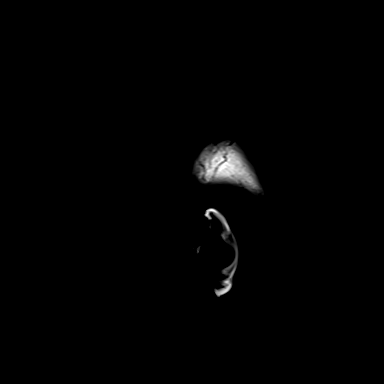

[Series 10: T2 · axial · 5.0mm · 0.53mm/px · z∈[-86,+79]mm · 2 of 29 slices shown (1 of 4)]
[im 1/29]
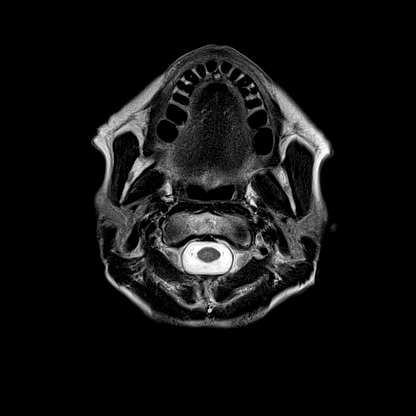
[im 29/29]
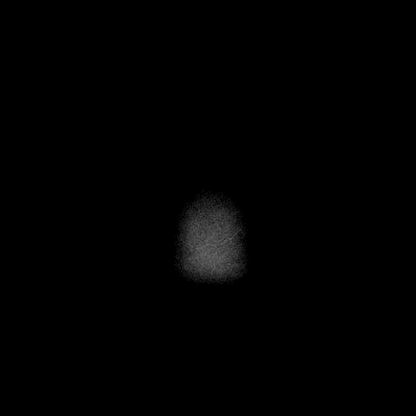

[Series 11: mag_images · axial · 3.0mm · 0.90mm/px · z∈[-91,+83]mm · 4 of 60 slices shown]
[im 1/60]
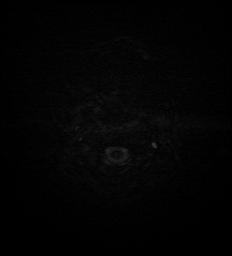
[im 20/60]
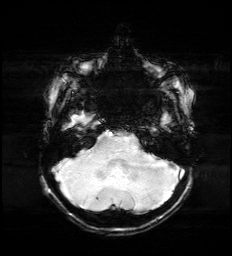
[im 40/60]
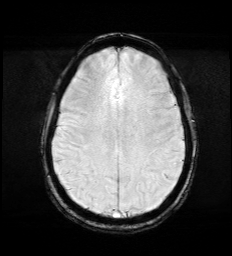
[im 60/60]
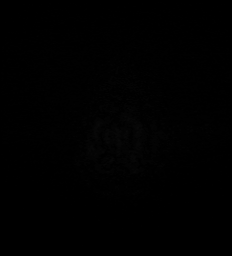

[Series 12: pha_images · axial · 3.0mm · 0.90mm/px · z∈[-88,+83]mm · 4 of 59 slices shown]
[im 1/59]
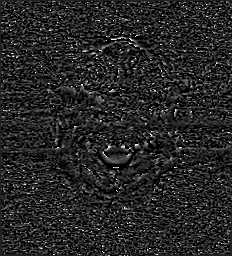
[im 20/59]
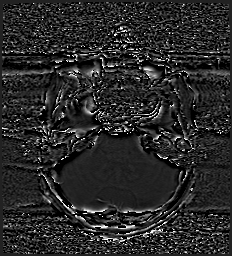
[im 39/59]
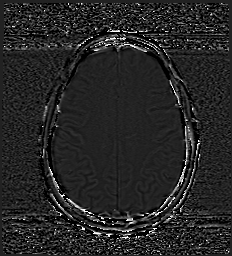
[im 59/59]
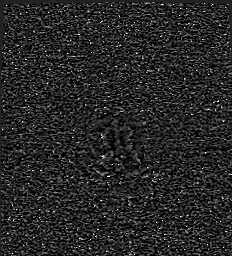

[Series 13: swi_images · axial · 3.0mm · 0.90mm/px · z∈[-91,+83]mm · 4 of 60 slices shown]
[im 1/60]
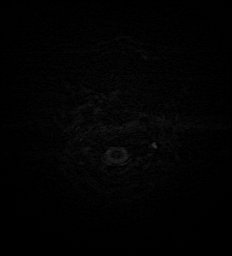
[im 20/60]
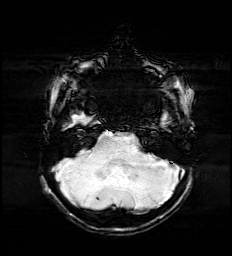
[im 40/60]
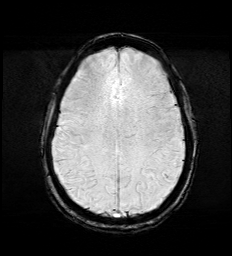
[im 60/60]
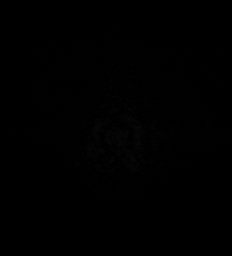

[Series 15: FLAIR · axial · 3.0mm · 0.53mm/px · z∈[-86,+79]mm · 3 of 57 slices shown (1 of 2)]
[im 1/57]
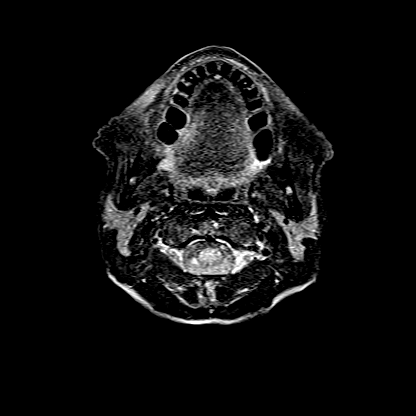
[im 29/57]
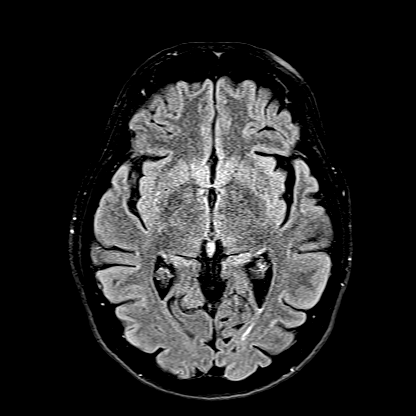
[im 57/57]
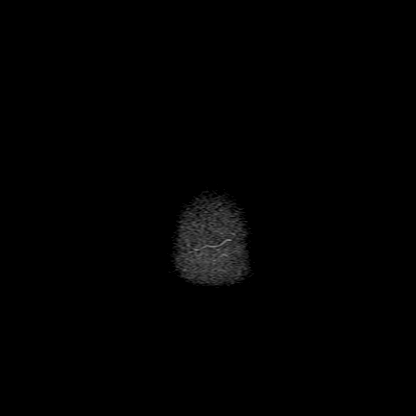

[Series 16: T1 · axial · 1.0mm · 0.98mm/px · z∈[-92,+79]mm · 8 of 176 slices shown (2 of 2)]
[im 1/176]
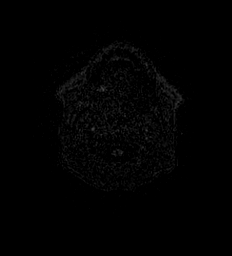
[im 36/176]
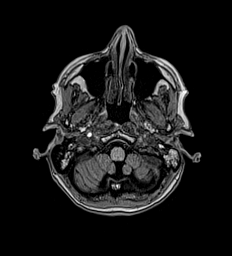
[im 53/176]
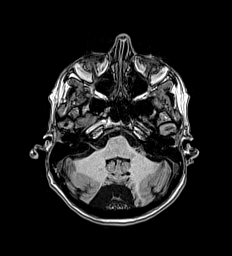
[im 71/176]
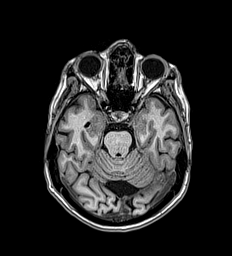
[im 106/176]
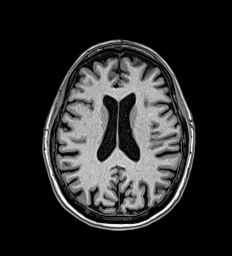
[im 123/176]
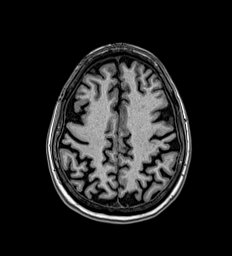
[im 141/176]
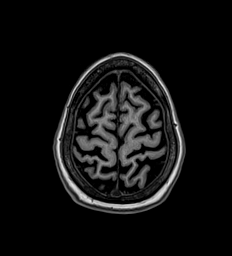
[im 176/176]
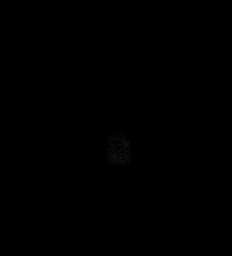

[Series 17: T2 · coronal · 5.0mm · 0.57mm/px · 2 of 31 slices shown (2 of 4)]
[im 1/31]
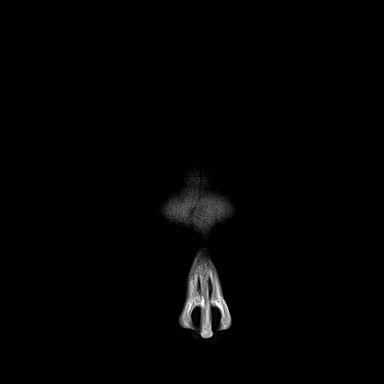
[im 31/31]
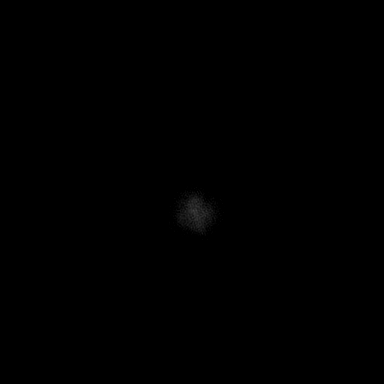

[Series 26: T2 · sagittal · 3.0mm · 0.62mm/px · 1 of 15 slices shown (3 of 4)]
[im 1/15]
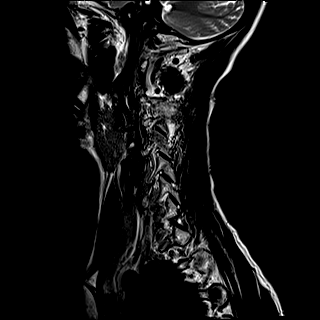

[Series 27: FLAIR · sagittal · 3.0mm · 0.78mm/px · 1 of 15 slices shown (2 of 2)]
[im 1/15]
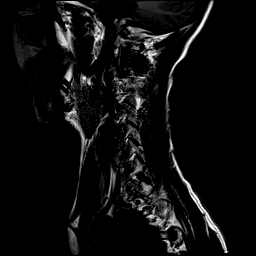

[Series 28: STIR · sagittal · 3.0mm · 0.62mm/px · 1 of 15 slices shown]
[im 1/15]
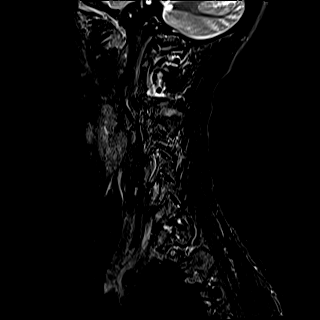

[Series 29: T2 · axial · 3.0mm · 0.70mm/px · z∈[-181,-94]mm · 2 of 27 slices shown (4 of 4)]
[im 1/27]
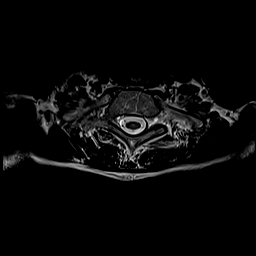
[im 27/27]
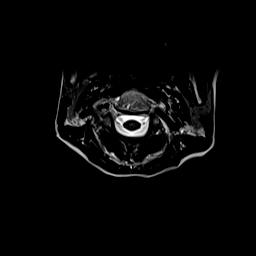

[43 of 48 positions shown; findings below may reference images not displayed]

FINDINGS: MRI HEAD FINDINGS

Brain: There is no evidence of acute infarct, intracranial
hemorrhage, mass, midline shift, or extra-axial fluid collection.
There is mild cerebral atrophy which is not necessarily abnormal for
age. No significant white matter disease is seen. An enlarged
extra-axial CSF space in the midline of the posterior fossa
posterior to the cerebellum measures 4 x 2 cm with slight mass
effect on the medial aspects of both cerebellar hemispheres
potentially reflecting an arachnoid cyst versus mega cisterna magna.

Vascular: Major intracranial vascular flow voids are preserved.

Skull and upper cervical spine: Unremarkable bone marrow signal.

Sinuses/Orbits: Bilateral cataract extraction. Small right mastoid
effusion. Clear paranasal sinuses.

Other: None.

MRI CERVICAL SPINE FINDINGS

Alignment: Straightening/slight reversal of the normal cervical
lordosis.

Vertebrae: No fracture or suspicious osseous lesion. Moderate disc
space narrowing and mild degenerative endplate marrow changes at
C5-6 and C6-7. Mild left-sided facet edema at C7-T1, degenerative in
appearance.

Cord: Normal signal and morphology.

Posterior Fossa, vertebral arteries, paraspinal tissues:
Unremarkable.

Disc levels: C2-3: Asymmetric right facet arthrosis with ankylosis.
No disc herniation or stenosis.

C3-4: Left uncovertebral spurring and severe left facet arthrosis
result in moderate left neural foraminal stenosis without spinal
stenosis.

C4-5: Mild disc bulging, mild uncovertebral spurring, and mild left
facet arthrosis without stenosis.

C5-6: Broad-based posterior disc osteophyte complex and asymmetric
right uncovertebral spurring result in moderate to severe right and
mild left neural foraminal stenosis and mild spinal stenosis with
potential right C6 nerve root impingement.

C6-7: Broad-based posterior disc osteophyte complex results in
borderline spinal stenosis and mild right greater than left neural
foraminal stenosis.

C7-T1: Mild right and moderate to severe left facet arthrosis
without disc herniation or stenosis. Perineural cyst in the left
neural foramen.
IMPRESSION: 1. No acute or significant intracranial findings.
2. Incidental posterior fossa arachnoid cyst versus mega cisterna
magna.
3. Cervical disc degeneration greatest at C5-6 where there is
moderate to severe right neural foraminal stenosis and mild spinal
stenosis.
4. Normal appearance of the cervical spinal cord.

## 2020-12-15 DIAGNOSIS — L03115 Cellulitis of right lower limb: Secondary | ICD-10-CM | POA: Diagnosis not present

## 2020-12-15 DIAGNOSIS — Z9889 Other specified postprocedural states: Secondary | ICD-10-CM | POA: Diagnosis not present

## 2020-12-19 DIAGNOSIS — M2012 Hallux valgus (acquired), left foot: Secondary | ICD-10-CM | POA: Diagnosis not present

## 2020-12-19 DIAGNOSIS — M2011 Hallux valgus (acquired), right foot: Secondary | ICD-10-CM | POA: Diagnosis not present

## 2020-12-28 ENCOUNTER — Ambulatory Visit: Payer: PPO | Admitting: Nurse Practitioner

## 2021-01-16 DIAGNOSIS — C44329 Squamous cell carcinoma of skin of other parts of face: Secondary | ICD-10-CM | POA: Diagnosis not present

## 2021-01-16 DIAGNOSIS — X32XXXA Exposure to sunlight, initial encounter: Secondary | ICD-10-CM | POA: Diagnosis not present

## 2021-01-16 DIAGNOSIS — Z85820 Personal history of malignant melanoma of skin: Secondary | ICD-10-CM | POA: Diagnosis not present

## 2021-01-16 DIAGNOSIS — D2271 Melanocytic nevi of right lower limb, including hip: Secondary | ICD-10-CM | POA: Diagnosis not present

## 2021-01-16 DIAGNOSIS — D0461 Carcinoma in situ of skin of right upper limb, including shoulder: Secondary | ICD-10-CM | POA: Diagnosis not present

## 2021-01-16 DIAGNOSIS — L570 Actinic keratosis: Secondary | ICD-10-CM | POA: Diagnosis not present

## 2021-01-16 DIAGNOSIS — Z85828 Personal history of other malignant neoplasm of skin: Secondary | ICD-10-CM | POA: Diagnosis not present

## 2021-01-16 DIAGNOSIS — D485 Neoplasm of uncertain behavior of skin: Secondary | ICD-10-CM | POA: Diagnosis not present

## 2021-01-16 DIAGNOSIS — D2261 Melanocytic nevi of right upper limb, including shoulder: Secondary | ICD-10-CM | POA: Diagnosis not present

## 2021-01-17 DIAGNOSIS — M2011 Hallux valgus (acquired), right foot: Secondary | ICD-10-CM | POA: Diagnosis not present

## 2021-01-17 DIAGNOSIS — M2012 Hallux valgus (acquired), left foot: Secondary | ICD-10-CM | POA: Diagnosis not present

## 2021-01-23 ENCOUNTER — Telehealth: Payer: Self-pay | Admitting: Nurse Practitioner

## 2021-01-23 NOTE — Telephone Encounter (Signed)
Copied from [place] [id number]. Topic: Medicare AWV >> Jan 23, 2021  3:37 PM Lavonia Drafts wrote: Reason for CRM:  Left message for patient to call back and schedule Medicare Annual Wellness Visit (AWV) to be done virtually or by telephone.  No hx of AWV eligible as of 04/14/09  Please schedule at anytime with [facility] Advisor.      33 Minutes appointment   Any questions, please call me at [phone]

## 2021-02-11 DIAGNOSIS — C44329 Squamous cell carcinoma of skin of other parts of face: Secondary | ICD-10-CM | POA: Diagnosis not present

## 2021-02-15 DIAGNOSIS — D0361 Melanoma in situ of right upper limb, including shoulder: Secondary | ICD-10-CM | POA: Diagnosis not present

## 2021-02-27 DIAGNOSIS — H35372 Puckering of macula, left eye: Secondary | ICD-10-CM | POA: Diagnosis not present

## 2021-03-18 DIAGNOSIS — L905 Scar conditions and fibrosis of skin: Secondary | ICD-10-CM | POA: Diagnosis not present

## 2021-04-11 ENCOUNTER — Telehealth: Payer: Self-pay | Admitting: Nurse Practitioner

## 2021-04-11 NOTE — Telephone Encounter (Signed)
Copied from [place] [phone]. Topic: Medicare AWV >> Apr 11, 2021  3:02 PM Lavonia Drafts wrote: Reason for CRM:  Left message for patient to call back and schedule Medicare Annual Wellness Visit (AWV) to be done virtually or by telephone.  No hx of AWV eligible as of 04/14/09  Please schedule at anytime with [facility] Health Advisor.      92 Minutes appointment   Any questions, please call me at [phone]

## 2021-04-29 DIAGNOSIS — J300 Vasomotor rhinitis: Secondary | ICD-10-CM | POA: Diagnosis not present

## 2021-04-29 DIAGNOSIS — H6123 Impacted cerumen, bilateral: Secondary | ICD-10-CM | POA: Diagnosis not present

## 2021-04-29 DIAGNOSIS — H903 Sensorineural hearing loss, bilateral: Secondary | ICD-10-CM | POA: Diagnosis not present

## 2021-05-22 ENCOUNTER — Telehealth: Payer: Self-pay | Admitting: Nurse Practitioner

## 2021-05-22 NOTE — Telephone Encounter (Signed)
Copied from [place] [id number]. Topic: Medicare AWV >> May 22, 2021 10:36 AM Lavonia Drafts wrote: Reason for CRM:  Left message for patient to call back and schedule Medicare Annual Wellness Visit (AWV) to be done virtually or by telephone.  No hx of AWV eligible as of 04/14/09  Please schedule at anytime with [facility] Health Advisor.      10 Minutes appointment   Any questions, please call me at [phone]

## 2021-07-15 DIAGNOSIS — G629 Polyneuropathy, unspecified: Secondary | ICD-10-CM | POA: Diagnosis not present

## 2021-07-15 DIAGNOSIS — R252 Cramp and spasm: Secondary | ICD-10-CM | POA: Diagnosis not present

## 2021-07-15 DIAGNOSIS — R2689 Other abnormalities of gait and mobility: Secondary | ICD-10-CM | POA: Diagnosis not present

## 2021-07-15 DIAGNOSIS — G2581 Restless legs syndrome: Secondary | ICD-10-CM | POA: Diagnosis not present

## 2021-07-15 DIAGNOSIS — G250 Essential tremor: Secondary | ICD-10-CM | POA: Diagnosis not present

## 2021-07-22 ENCOUNTER — Ambulatory Visit (INDEPENDENT_AMBULATORY_CARE_PROVIDER_SITE_OTHER): Payer: PPO | Admitting: Physician Assistant

## 2021-07-22 ENCOUNTER — Encounter: Payer: Self-pay | Admitting: Physician Assistant

## 2021-07-22 VITALS — BP 115/69 | HR 55 | Temp 98.5°F | Wt 114.6 lb

## 2021-07-22 DIAGNOSIS — M25561 Pain in right knee: Secondary | ICD-10-CM | POA: Diagnosis not present

## 2021-07-22 MED ORDER — MELOXICAM 15 MG PO TABS: 15.0000 mg | ORAL_TABLET | Freq: Every day | ORAL | 0 refills | Status: DC

## 2021-07-22 NOTE — Patient Instructions (Addendum)
Based on your exam I think your symptoms may be caused but the following: Bursitis or Arthritis  ? ?The management for both is rather similar and I recommend the following: ?Active rest- gentle stretches, casual walking, ?Warm compresses to the area ?Continue to use your knee brace as needed ?You can stop taking the Aleve and Ibuprofen as I would prefer you to take the Meloxicam 15 mg by mouth once per day instead ?You can use Voltaren gel the is over the counter as well to assist with pain as needed. ? ?If your symptoms are not improving or worsening please come back to the office so we can further evaluate.  ? ?It was nice to meet you and I appreciate the opportunity to be involved in your care ? ? ? ?

## 2021-07-22 NOTE — Progress Notes (Signed)
? ? ?  ?    Acute Office Visit ? ? ?Patient: Cassandra [name]   DOB: 10-12-1940   81 y.o. Female  MRN: [id number] ?Visit Date: 07/22/2021 ? ?Today's healthcare provider: Dani Gobble Janaria Mccammon, PA-C  ?Introduced myself to the patient as a Journalist, newspaper and provided education on APPs in clinical practice.  ? ?CC: right sided leg/ knee pain for the past 3 days  ? ?Subjective  ?  ?HPI ?HPI   ? ? Leg Pain   ? Additional comments: R leg pain along with R knee pain onset 3 days ago. OTC aleve for relief. States pain with bending and walking  ? ?  ?  ?Last edited by Olene Craven, Carolynn Serve, CMA on 07/22/2021  1:39 PM.  ?  ?  ? ?States her right knee started hurting very bad Sat ?States she used Aleve and numbing cream ( unsure what kind of cream it was ) ? ? ?Reports she still has pain in the knee and was having trouble bending it  ? ?Pain level: 3/10 today - feels it when she walks  ?Reports pain is worse in the evenings and first thing in the AM ? ?Denies injuries  but states in the past she has fallen a lot on her knees.  ?She has a history of neuropathy so she is not able to say if there is increased numbness of tingling distal to knee.  ?She has been using a knee brace as well as Aleve and Ibuprofen  ? ? ? ?Medications: ?Outpatient Medications Prior to Visit  ?Medication Sig  ? Carboxymethylcellul-Glycerin (LUBRICATING EYE DROPS OP) Place 1 drop into both eyes daily as needed (dry eyes).  ? ferrous sulfate 325 (65 FE) MG tablet Take 325 mg by mouth every other day.  ? ipratropium (ATROVENT) 0.03 % nasal spray Place 1 spray into both nostrils daily as needed for rhinitis.  ? Magnesium 300 MG CAPS Take by mouth.  ? vitamin B-12 (CYANOCOBALAMIN) 1000 MCG tablet Take 1,000 mcg by mouth every other day.  ? primidone (MYSOLINE) 50 MG tablet Take by mouth.  ? [DISCONTINUED] gabapentin (NEURONTIN) 400 MG capsule Take 1 capsule (400 mg total) by mouth 3 (three) times daily. (Patient not taking: Reported on 07/22/2021)  ? [DISCONTINUED]  HYDROcodone-acetaminophen (NORCO) 5-325 MG tablet Take 1-2 tablets by mouth every 6 (six) hours as needed. (Patient not taking: Reported on 07/22/2021)  ? [DISCONTINUED] meloxicam (MOBIC) 15 MG tablet Take 1 tablet (15 mg total) by mouth daily. (Patient not taking: Reported on 07/22/2021)  ? ?No facility-administered medications prior to visit.  ? ? ?Review of Systems  ?Musculoskeletal:  Positive for arthralgias. Negative for myalgias.  ?Neurological:  Positive for weakness and numbness.  ? ? ?  Objective  ?  ?BP 115/69   Pulse (!) 55   Temp 98.5 ?F (36.9 ?C) (Oral)   Wt 114 lb 9.6 oz (52 kg)   SpO2 98%   BMI 19.67 kg/m?  ? ? ?Physical Exam ?Constitutional:   ?   General: She is awake.  ?   Appearance: Normal appearance. She is well-developed, well-groomed and normal weight.  ?HENT:  ?   Head: Normocephalic and atraumatic.  ?Musculoskeletal:  ?   Right knee: Bony tenderness and crepitus present. No swelling, deformity, effusion or ecchymosis. Normal range of motion. Tenderness present over the lateral joint line. No LCL laxity, MCL laxity, ACL laxity or PCL laxity. Normal meniscus.  ?   Instability Tests: Anterior drawer test negative. Posterior  drawer test negative. Medial McMurray test negative and lateral McMurray test negative.  ?   Left knee: Crepitus present. No swelling, deformity, effusion or ecchymosis. Normal range of motion. No LCL laxity, MCL laxity, ACL laxity or PCL laxity.Normal meniscus.  ?   Instability Tests: Anterior drawer test negative. Posterior drawer test negative. Medial McMurray test negative and lateral McMurray test negative.  ?Neurological:  ?   Mental Status: She is alert.  ?Psychiatric:     ?   Attention and Perception: Attention normal.     ?   Mood and Affect: Mood and affect normal.     ?   Speech: Speech normal.     ?   Behavior: Behavior normal. Behavior is cooperative.  ?  ? ? ?No results found for any visits on 07/22/21. ? Assessment & Plan  ?  ? ?Problem List Items Addressed  This Visit   ?None ?Visit Diagnoses   ? ? Acute pain of right knee    -  Primary ?Acute, new problem, potentially resolving ?No laxity or pain with joint testing leads to reduced suspicion or ligament injury or meniscus tear at this time ?Patient describes lateral midjoint knee pain that could be consistent with bursitis or osteoarthritis ?Recommend conservative measures at this time comprised of warm compresses, light stretches, gentle massage and light activity  ?Will provide meloxicam 15 mg PO QD instead of Aleve/ Ibuprofen to reduce pill burden ?Recommend she try these measures for at least 1-2 weeks - if not improving recommend she return to office to be evaluated with imaging and potential joint injection or Ortho referral ? ?  ? Relevant Medications  ? meloxicam (MOBIC) 15 MG tablet  ? ?  ? ? ? ?No follow-ups on file. ? ? ?I, Bryli Mantey E Eleena Grater, PA-C, have reviewed all documentation for this visit. The documentation on 07/22/21 for the exam, diagnosis, procedures, and orders are all accurate and complete. ? ? ?Sarra Rachels, MHS, PA-C ?[facility] ?[facility]  ? ? ? ? ? ?

## 2021-07-24 DIAGNOSIS — Z85820 Personal history of malignant melanoma of skin: Secondary | ICD-10-CM | POA: Diagnosis not present

## 2021-07-24 DIAGNOSIS — D2272 Melanocytic nevi of left lower limb, including hip: Secondary | ICD-10-CM | POA: Diagnosis not present

## 2021-07-24 DIAGNOSIS — D2261 Melanocytic nevi of right upper limb, including shoulder: Secondary | ICD-10-CM | POA: Diagnosis not present

## 2021-07-24 DIAGNOSIS — D485 Neoplasm of uncertain behavior of skin: Secondary | ICD-10-CM | POA: Diagnosis not present

## 2021-07-24 DIAGNOSIS — L821 Other seborrheic keratosis: Secondary | ICD-10-CM | POA: Diagnosis not present

## 2021-07-24 DIAGNOSIS — Z85828 Personal history of other malignant neoplasm of skin: Secondary | ICD-10-CM | POA: Diagnosis not present

## 2021-07-24 DIAGNOSIS — D0439 Carcinoma in situ of skin of other parts of face: Secondary | ICD-10-CM | POA: Diagnosis not present

## 2021-07-24 DIAGNOSIS — X32XXXA Exposure to sunlight, initial encounter: Secondary | ICD-10-CM | POA: Diagnosis not present

## 2021-08-27 ENCOUNTER — Telehealth: Payer: Self-pay | Admitting: Nurse Practitioner

## 2021-08-27 NOTE — Telephone Encounter (Signed)
Copied from [place] [id number]. Topic: Medicare AWV ?>> Aug 27, 2021  1:27 PM Lavonia Drafts wrote: ?Reason for CRM:  ?Left message for patient to call back and schedule Medicare Annual Wellness Visit (AWV) to be done virtually or by telephone. ? ?No hx of AWV eligible as of 04/14/09 ? ?Please schedule at anytime with [facility] Health Advisor.     ? ?45 Minutes appointment  ? ?Any questions, please call me at [phone] ?

## 2021-09-12 DIAGNOSIS — S01112S Laceration without foreign body of left eyelid and periocular area, sequela: Secondary | ICD-10-CM | POA: Diagnosis not present

## 2021-09-12 DIAGNOSIS — C441292 Squamous cell carcinoma of skin of left lower eyelid, including canthus: Secondary | ICD-10-CM | POA: Diagnosis not present

## 2021-09-12 DIAGNOSIS — S01411S Laceration without foreign body of right cheek and temporomandibular area, sequela: Secondary | ICD-10-CM | POA: Diagnosis not present

## 2021-09-12 DIAGNOSIS — C44321 Squamous cell carcinoma of skin of nose: Secondary | ICD-10-CM | POA: Diagnosis not present

## 2021-09-12 DIAGNOSIS — S0181XS Laceration without foreign body of other part of head, sequela: Secondary | ICD-10-CM | POA: Diagnosis not present

## 2021-09-12 DIAGNOSIS — S01412S Laceration without foreign body of left cheek and temporomandibular area, sequela: Secondary | ICD-10-CM | POA: Diagnosis not present

## 2021-09-12 DIAGNOSIS — S01111S Laceration without foreign body of right eyelid and periocular area, sequela: Secondary | ICD-10-CM | POA: Diagnosis not present

## 2021-09-16 ENCOUNTER — Ambulatory Visit: Payer: PPO | Admitting: Plastic Surgery

## 2021-09-16 ENCOUNTER — Encounter: Payer: Self-pay | Admitting: Plastic Surgery

## 2021-09-16 VITALS — BP 110/69 | HR 57 | Ht 65.0 in | Wt 111.8 lb

## 2021-09-16 DIAGNOSIS — C44121 Squamous cell carcinoma of skin of unspecified eyelid, including canthus: Secondary | ICD-10-CM | POA: Diagnosis not present

## 2021-09-16 DIAGNOSIS — Z719 Counseling, unspecified: Secondary | ICD-10-CM

## 2021-09-16 DIAGNOSIS — C44321 Squamous cell carcinoma of skin of nose: Secondary | ICD-10-CM

## 2021-09-18 NOTE — Progress Notes (Signed)
Referring Provider Venita Lick, NP [place],  [place] [address]   CC:  Squamous cell carcinoma of the nose   Cassandra [name] is an 81 y.o. female.  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  She is to undergo Mohs surgery.  She is sent by Dr. Sabino Dick for possible combined closure case.    No Known Allergies  Outpatient Encounter Medications as of 09/16/2021  Medication Sig   Carboxymethylcellul-Glycerin (LUBRICATING EYE DROPS OP) Place 1 drop into both eyes daily as needed (dry eyes).   ferrous sulfate 325 (65 FE) MG tablet Take 325 mg by mouth every other day.   ipratropium (ATROVENT) 0.03 % nasal spray Place 1 spray into both nostrils daily as needed for rhinitis.   Magnesium 300 MG CAPS Take by mouth.   meloxicam (MOBIC) 15 MG tablet Take 1 tablet (15 mg total) by mouth daily.   primidone (MYSOLINE) 50 MG tablet Take by mouth.   vitamin B-12 (CYANOCOBALAMIN) 1000 MCG tablet Take 1,000 mcg by mouth every other day.   No facility-administered encounter medications on file as of 09/16/2021.     Past Medical History:  Diagnosis Date   Cancer ([place])    skin cancer   Gout    Hepatitis    40 years ago   Irritable bowel syndrome    history of....   Nerve damage of left foot     Past Surgical History:  Procedure Laterality Date   ABDOMINAL HYSTERECTOMY     BREAST SURGERY     BIOPSY   BUNIONECTOMY Bilateral 10/25/2020   Procedure: Lillard Anes;  Surgeon: Earnestine Leys, MD;  Location: [facility] ORS;  Service: Orthopedics;  Laterality: Bilateral;   CATARACT EXTRACTION W/PHACO Left 11/13/2015   Procedure: CATARACT EXTRACTION PHACO AND INTRAOCULAR LENS PLACEMENT (IOC);  Surgeon: Birder Robson, MD;  Location: [facility] ORS;  Service: Ophthalmology;  Laterality: Left;  [place] 00:31 AP% 11.9 CDE 3.72 Fluid pack  lot # [id number]   CATARACT EXTRACTION W/PHACO Right 12/11/2015   Procedure: CATARACT EXTRACTION  PHACO AND INTRAOCULAR LENS PLACEMENT (IOC);  Surgeon: Birder Robson, MD;  Location: [facility] ORS;  Service: Ophthalmology;  Laterality: Right;  Lot# [id number] H [place]: 00:42.4 AP%: 18.9 CDE:8.01    COLONOSCOPY     SKIN CANCER EXCISION     TONSILLECTOMY      Family History  Problem Relation Age of Onset   Kidney Stones Mother    Cancer Father    Cancer Sister        colon and ovarian    Social History   Social History Narrative   Not on file     Review of Systems General: Denies fevers, chills, weight loss CV: Denies chest pain, shortness of breath, palpitations   Physical Exam    09/16/2021    9:55 AM 07/22/2021    1:35 PM 10/25/2020   11:47 AM  Vitals with BMI  Height '5\' 5"'$     Weight 111 lbs 13 oz 114 lbs 10 oz   BMI 43.3    Systolic 295 188 416  Diastolic 69 69 57  Pulse 57 55 42    General:  No acute distress,  Alert and oriented, Non-Toxic, Normal speech and affect HEENT: Scarring over the bridge of the nose and medial canthal area.  Carcinoma borders are not clearly defined  Assessment/Plan I will plan to assist with reconstruction including possible local tissue rearrangement, local  flap, skin graft or skin graft substitute.    Lennice Sites 09/18/2021, 9:49 AM

## 2021-10-14 ENCOUNTER — Encounter: Payer: Self-pay | Admitting: Plastic Surgery

## 2021-10-14 ENCOUNTER — Ambulatory Visit: Payer: PPO | Admitting: Plastic Surgery

## 2021-10-14 VITALS — BP 122/75 | HR 73 | Ht 65.0 in | Wt 111.0 lb

## 2021-10-14 DIAGNOSIS — C44121 Squamous cell carcinoma of skin of unspecified eyelid, including canthus: Secondary | ICD-10-CM

## 2021-10-14 DIAGNOSIS — C44321 Squamous cell carcinoma of skin of nose: Secondary | ICD-10-CM

## 2021-10-16 NOTE — Progress Notes (Signed)
CC:  Squamous cell carcinoma of the nose     Cassandra [name] is an 81 y.o. female.  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  She is to undergo Mohs surgery.  She is sent by Dr. Sabino Dick for possible combined closure case.       No Known Allergies       Outpatient Encounter Medications as of 09/16/2021  Medication Sig   Carboxymethylcellul-Glycerin (LUBRICATING EYE DROPS OP) Place 1 drop into both eyes daily as needed (dry eyes).   ferrous sulfate 325 (65 FE) MG tablet Take 325 mg by mouth every other day.   ipratropium (ATROVENT) 0.03 % nasal spray Place 1 spray into both nostrils daily as needed for rhinitis.   Magnesium 300 MG CAPS Take by mouth.   meloxicam (MOBIC) 15 MG tablet Take 1 tablet (15 mg total) by mouth daily.   primidone (MYSOLINE) 50 MG tablet Take by mouth.   vitamin B-12 (CYANOCOBALAMIN) 1000 MCG tablet Take 1,000 mcg by mouth every other day.    No facility-administered encounter medications on file as of 09/16/2021.          Past Medical History:  Diagnosis Date   Cancer ([place])      skin cancer   Gout     Hepatitis      40 years ago   Irritable bowel syndrome      history of....   Nerve damage of left foot             Past Surgical History:  Procedure Laterality Date   ABDOMINAL HYSTERECTOMY       BREAST SURGERY        BIOPSY   BUNIONECTOMY Bilateral 10/25/2020    Procedure: Lillard Anes;  Surgeon: Earnestine Leys, MD;  Location: [facility] ORS;  Service: Orthopedics;  Laterality: Bilateral;   CATARACT EXTRACTION W/PHACO Left 11/13/2015    Procedure: CATARACT EXTRACTION PHACO AND INTRAOCULAR LENS PLACEMENT (IOC);  Surgeon: Birder Robson, MD;  Location: [facility] ORS;  Service: Ophthalmology;  Laterality: Left;  [place] 00:31 AP% 11.9 CDE 3.72 Fluid pack  lot # [id number]   CATARACT EXTRACTION W/PHACO Right 12/11/2015    Procedure: CATARACT EXTRACTION PHACO AND INTRAOCULAR LENS PLACEMENT (IOC);   Surgeon: Birder Robson, MD;  Location: [facility] ORS;  Service: Ophthalmology;  Laterality: Right;  Lot# [id number] H [place]: 00:42.4 AP%: 18.9 CDE:8.01     COLONOSCOPY       SKIN CANCER EXCISION       TONSILLECTOMY               Family History  Problem Relation Age of Onset   Kidney Stones Mother     Cancer Father     Cancer Sister          colon and ovarian      Social History       Social History Narrative   Not on file      Review of Systems General: Denies fevers, chills, weight loss CV: Denies chest pain, shortness of breath, palpitations     Physical Exam     09/16/2021    9:55 AM 07/22/2021    1:35 PM 10/25/2020   11:47 AM  Vitals with BMI  Height '5\' 5"'$       Weight 111 lbs 13 oz 114 lbs 10 oz    BMI 45.4      Systolic 098 119 147  Diastolic 69 69 57  Pulse 57 55 42    General:  No acute distress,  Alert and oriented, Non-Toxic, Normal speech and affect HEENT: Scarring over the bridge of the nose and medial canthal area.  Carcinoma borders are not clearly defined   Assessment/Plan I will plan to assist with reconstruction including possible local tissue rearrangement, local flap, skin graft or skin graft substitute.  Risks and benefits discussed and the patient wishes to proceed.

## 2021-10-22 DIAGNOSIS — D04122 Carcinoma in situ of skin of left lower eyelid, including canthus: Secondary | ICD-10-CM | POA: Diagnosis not present

## 2021-10-23 DIAGNOSIS — C44321 Squamous cell carcinoma of skin of nose: Secondary | ICD-10-CM | POA: Diagnosis not present

## 2021-10-23 DIAGNOSIS — C441292 Squamous cell carcinoma of skin of left lower eyelid, including canthus: Secondary | ICD-10-CM | POA: Diagnosis not present

## 2021-10-23 DIAGNOSIS — Z85828 Personal history of other malignant neoplasm of skin: Secondary | ICD-10-CM | POA: Diagnosis not present

## 2021-10-23 DIAGNOSIS — Z483 Aftercare following surgery for neoplasm: Secondary | ICD-10-CM | POA: Diagnosis not present

## 2021-10-23 DIAGNOSIS — Z481 Encounter for planned postprocedural wound closure: Secondary | ICD-10-CM | POA: Diagnosis not present

## 2021-10-23 DIAGNOSIS — C441291 Squamous cell carcinoma of skin of left upper eyelid, including canthus: Secondary | ICD-10-CM | POA: Diagnosis not present

## 2021-10-23 DIAGNOSIS — S01112A Laceration without foreign body of left eyelid and periocular area, initial encounter: Secondary | ICD-10-CM | POA: Diagnosis not present

## 2021-10-23 MED ORDER — OXYCODONE HCL 5 MG PO TABS: 5.0000 mg | ORAL_TABLET | ORAL | 0 refills | Status: DC | PRN

## 2021-10-23 NOTE — Addendum Note (Signed)
Addended by: Lennice Sites on: 10/23/2021 05:23 PM   Modules accepted: Orders

## 2021-10-24 ENCOUNTER — Telehealth: Payer: Self-pay | Admitting: Nurse Practitioner

## 2021-10-24 NOTE — Telephone Encounter (Signed)
Copied from [place] [phone]. Topic: Medicare AWV >> Oct 24, 2021 11:51 AM Josephina Gip wrote: Reason for CRM:  Left message for patient to call back and schedule Medicare Annual Wellness Visit (AWV) to be done virtually or by telephone.  No hx of AWV eligible as of 04/14/09  Please schedule at anytime with [facility] Health Advisor.      58 Minutes appointment   Any questions, please call me at [phone]

## 2021-11-01 ENCOUNTER — Encounter: Payer: Self-pay | Admitting: Plastic Surgery

## 2021-11-01 ENCOUNTER — Ambulatory Visit (INDEPENDENT_AMBULATORY_CARE_PROVIDER_SITE_OTHER): Payer: PPO | Admitting: Plastic Surgery

## 2021-11-01 DIAGNOSIS — C44121 Squamous cell carcinoma of skin of unspecified eyelid, including canthus: Secondary | ICD-10-CM

## 2021-11-01 DIAGNOSIS — C441291 Squamous cell carcinoma of skin of left upper eyelid, including canthus: Secondary | ICD-10-CM

## 2021-11-01 NOTE — Progress Notes (Signed)
Patient is status post to left medial canthal area and nose.  She is doing well and happy with her initial result.  Physical exam Skin graft intact, excellent initial take and contour  Assessment and plan Patient doing well we will see her back in a couple weeks for recheck to follow the progress of her healing.

## 2021-11-15 ENCOUNTER — Ambulatory Visit (INDEPENDENT_AMBULATORY_CARE_PROVIDER_SITE_OTHER): Payer: PPO | Admitting: Plastic Surgery

## 2021-11-15 DIAGNOSIS — C44121 Squamous cell carcinoma of skin of unspecified eyelid, including canthus: Secondary | ICD-10-CM

## 2021-11-15 DIAGNOSIS — C441291 Squamous cell carcinoma of skin of left upper eyelid, including canthus: Secondary | ICD-10-CM

## 2021-11-15 NOTE — Progress Notes (Signed)
Status post skin grafting to left medial canthal area patient is doing well and happy with her initial result  Physical exam Skin graft healing very well with good color match to the other side.  Assessment and plan Patient is continue to heal very well.  She notices some disturbance in her tear film and I told her that she can check with her ophthalmologist about this or to Dr. Sabino Dick.  I would like to continue to follow her and see her back in approximately 6 months.

## 2021-12-26 ENCOUNTER — Telehealth: Payer: Self-pay

## 2021-12-26 NOTE — Telephone Encounter (Signed)
Per Cassandra Gave T.  9/14 at 2:30, CPT code 15260 does not require PA

## 2022-01-14 DIAGNOSIS — J988 Other specified respiratory disorders: Secondary | ICD-10-CM | POA: Diagnosis not present

## 2022-01-14 DIAGNOSIS — G629 Polyneuropathy, unspecified: Secondary | ICD-10-CM | POA: Diagnosis not present

## 2022-01-14 DIAGNOSIS — I959 Hypotension, unspecified: Secondary | ICD-10-CM | POA: Diagnosis not present

## 2022-01-14 DIAGNOSIS — R490 Dysphonia: Secondary | ICD-10-CM | POA: Diagnosis not present

## 2022-01-14 DIAGNOSIS — R2689 Other abnormalities of gait and mobility: Secondary | ICD-10-CM | POA: Diagnosis not present

## 2022-01-14 DIAGNOSIS — G250 Essential tremor: Secondary | ICD-10-CM | POA: Diagnosis not present

## 2022-01-14 DIAGNOSIS — K219 Gastro-esophageal reflux disease without esophagitis: Secondary | ICD-10-CM | POA: Diagnosis not present

## 2022-02-03 ENCOUNTER — Telehealth: Payer: Self-pay | Admitting: Nurse Practitioner

## 2022-02-03 NOTE — Telephone Encounter (Signed)
Left message for patient to call back and schedule Medicare Annual Wellness Visit (AWV) to be done virtually or by telephone.  No hx of AWV eligible as of 04/14/09  Please schedule at anytime with [facility] Advisor.      59 Minutes appointment   Any questions, please call me at [phone]

## 2022-02-05 DIAGNOSIS — D2261 Melanocytic nevi of right upper limb, including shoulder: Secondary | ICD-10-CM | POA: Diagnosis not present

## 2022-02-05 DIAGNOSIS — Z85820 Personal history of malignant melanoma of skin: Secondary | ICD-10-CM | POA: Diagnosis not present

## 2022-02-05 DIAGNOSIS — L570 Actinic keratosis: Secondary | ICD-10-CM | POA: Diagnosis not present

## 2022-02-05 DIAGNOSIS — D2271 Melanocytic nevi of right lower limb, including hip: Secondary | ICD-10-CM | POA: Diagnosis not present

## 2022-02-05 DIAGNOSIS — I788 Other diseases of capillaries: Secondary | ICD-10-CM | POA: Diagnosis not present

## 2022-02-05 DIAGNOSIS — Z85828 Personal history of other malignant neoplasm of skin: Secondary | ICD-10-CM | POA: Diagnosis not present

## 2022-02-05 DIAGNOSIS — D225 Melanocytic nevi of trunk: Secondary | ICD-10-CM | POA: Diagnosis not present

## 2022-02-27 DIAGNOSIS — H35372 Puckering of macula, left eye: Secondary | ICD-10-CM | POA: Diagnosis not present

## 2022-03-13 DIAGNOSIS — C441291 Squamous cell carcinoma of skin of left upper eyelid, including canthus: Secondary | ICD-10-CM | POA: Diagnosis not present

## 2022-03-13 DIAGNOSIS — H00025 Hordeolum internum left lower eyelid: Secondary | ICD-10-CM | POA: Diagnosis not present

## 2022-03-13 DIAGNOSIS — C441292 Squamous cell carcinoma of skin of left lower eyelid, including canthus: Secondary | ICD-10-CM | POA: Diagnosis not present

## 2022-03-13 DIAGNOSIS — H0279 Other degenerative disorders of eyelid and periocular area: Secondary | ICD-10-CM | POA: Diagnosis not present

## 2022-04-21 ENCOUNTER — Telehealth: Payer: Self-pay | Admitting: Plastic Surgery

## 2022-04-21 NOTE — Telephone Encounter (Signed)
Spoke to patient and informed we would still like to see her back at her February appointment to check the skin graft area. Pt conveyed understanding.

## 2022-04-21 NOTE — Telephone Encounter (Signed)
Pt is doing her f/u with RUBINSTEIN, TAL J MD, and was wondering if she also needed to do f/u with [place] as well? Pt just wanted a call back when possible.

## 2022-04-23 DIAGNOSIS — C441291 Squamous cell carcinoma of skin of left upper eyelid, including canthus: Secondary | ICD-10-CM | POA: Diagnosis not present

## 2022-04-23 DIAGNOSIS — H0279 Other degenerative disorders of eyelid and periocular area: Secondary | ICD-10-CM | POA: Diagnosis not present

## 2022-04-23 DIAGNOSIS — H00025 Hordeolum internum left lower eyelid: Secondary | ICD-10-CM | POA: Diagnosis not present

## 2022-04-23 DIAGNOSIS — C441292 Squamous cell carcinoma of skin of left lower eyelid, including canthus: Secondary | ICD-10-CM | POA: Diagnosis not present

## 2022-04-29 DIAGNOSIS — K219 Gastro-esophageal reflux disease without esophagitis: Secondary | ICD-10-CM | POA: Diagnosis not present

## 2022-04-29 DIAGNOSIS — R0989 Other specified symptoms and signs involving the circulatory and respiratory systems: Secondary | ICD-10-CM | POA: Diagnosis not present

## 2022-04-29 DIAGNOSIS — J301 Allergic rhinitis due to pollen: Secondary | ICD-10-CM | POA: Diagnosis not present

## 2022-05-19 ENCOUNTER — Ambulatory Visit: Payer: PPO | Admitting: Plastic Surgery

## 2022-05-19 ENCOUNTER — Encounter: Payer: Self-pay | Admitting: Plastic Surgery

## 2022-05-19 ENCOUNTER — Ambulatory Visit (INDEPENDENT_AMBULATORY_CARE_PROVIDER_SITE_OTHER): Payer: PPO | Admitting: Plastic Surgery

## 2022-05-19 VITALS — BP 118/55 | HR 62

## 2022-05-19 DIAGNOSIS — C44321 Squamous cell carcinoma of skin of nose: Secondary | ICD-10-CM | POA: Diagnosis not present

## 2022-05-19 DIAGNOSIS — C44121 Squamous cell carcinoma of skin of unspecified eyelid, including canthus: Secondary | ICD-10-CM

## 2022-05-19 NOTE — Progress Notes (Signed)
Cassandra [name] returns today approximately 6 months after placement of a skin graft on the left lateral aspect of the nose at the medial canthus.  Skin graft was placed to close a Mohs defect from excision of a skin cancer.  This was done in conjunction with oculoplastics.  She is very pleased with her result.  No complaints today.  She did ask that I look at her eye lid as she apparently had a lesion on there that was concerning but the lesion has resolved since she started steroids.  On examination she has a very well incorporated very aesthetically pleasing skin graft on the medial aspect of the eye and lateral aspect of the nose.  There is no lesion on the lid that I can discern.  Patient is doing well from her skin graft.  No follow-up is necessary at this time we did discuss use of collagen supplements and use of hyaluronic acid to improve the quality of the skin though there is very little that can be done to aging skin to completely reverse the effects of aging.  She may return as needed.

## 2022-06-12 DIAGNOSIS — R519 Headache, unspecified: Secondary | ICD-10-CM | POA: Diagnosis not present

## 2022-06-12 DIAGNOSIS — J019 Acute sinusitis, unspecified: Secondary | ICD-10-CM | POA: Diagnosis not present

## 2022-06-18 DIAGNOSIS — M170 Bilateral primary osteoarthritis of knee: Secondary | ICD-10-CM | POA: Diagnosis not present

## 2022-06-30 DIAGNOSIS — J019 Acute sinusitis, unspecified: Secondary | ICD-10-CM | POA: Diagnosis not present

## 2022-06-30 DIAGNOSIS — H6123 Impacted cerumen, bilateral: Secondary | ICD-10-CM | POA: Diagnosis not present

## 2022-06-30 DIAGNOSIS — J305 Allergic rhinitis due to food: Secondary | ICD-10-CM | POA: Diagnosis not present

## 2022-06-30 DIAGNOSIS — J301 Allergic rhinitis due to pollen: Secondary | ICD-10-CM | POA: Diagnosis not present

## 2022-07-23 DIAGNOSIS — H0279 Other degenerative disorders of eyelid and periocular area: Secondary | ICD-10-CM | POA: Diagnosis not present

## 2022-07-23 DIAGNOSIS — C441292 Squamous cell carcinoma of skin of left lower eyelid, including canthus: Secondary | ICD-10-CM | POA: Diagnosis not present

## 2022-07-23 DIAGNOSIS — H00025 Hordeolum internum left lower eyelid: Secondary | ICD-10-CM | POA: Diagnosis not present

## 2022-07-23 DIAGNOSIS — C441291 Squamous cell carcinoma of skin of left upper eyelid, including canthus: Secondary | ICD-10-CM | POA: Diagnosis not present

## 2022-08-06 DIAGNOSIS — L814 Other melanin hyperpigmentation: Secondary | ICD-10-CM | POA: Diagnosis not present

## 2022-08-06 DIAGNOSIS — D2261 Melanocytic nevi of right upper limb, including shoulder: Secondary | ICD-10-CM | POA: Diagnosis not present

## 2022-08-06 DIAGNOSIS — Z85820 Personal history of malignant melanoma of skin: Secondary | ICD-10-CM | POA: Diagnosis not present

## 2022-08-06 DIAGNOSIS — D2271 Melanocytic nevi of right lower limb, including hip: Secondary | ICD-10-CM | POA: Diagnosis not present

## 2022-08-06 DIAGNOSIS — Z08 Encounter for follow-up examination after completed treatment for malignant neoplasm: Secondary | ICD-10-CM | POA: Diagnosis not present

## 2022-08-06 DIAGNOSIS — D2262 Melanocytic nevi of left upper limb, including shoulder: Secondary | ICD-10-CM | POA: Diagnosis not present

## 2022-08-07 DIAGNOSIS — G250 Essential tremor: Secondary | ICD-10-CM | POA: Diagnosis not present

## 2022-08-07 DIAGNOSIS — G629 Polyneuropathy, unspecified: Secondary | ICD-10-CM | POA: Diagnosis not present

## 2022-08-07 DIAGNOSIS — R2689 Other abnormalities of gait and mobility: Secondary | ICD-10-CM | POA: Diagnosis not present

## 2022-11-09 DIAGNOSIS — E7800 Pure hypercholesterolemia, unspecified: Secondary | ICD-10-CM | POA: Insufficient documentation

## 2022-11-09 NOTE — Patient Instructions (Incomplete)
Restless Legs Syndrome Restless legs syndrome is a condition that causes uncomfortable feelings or sensations in the legs, especially while sitting or lying down. The sensations usually cause an overwhelming urge to move the legs. The arms can also sometimes be affected. The condition can range from mild to severe. The symptoms often interfere with a person's ability to sleep. What are the causes? The cause of this condition is not known. What increases the risk? The following factors may make you more likely to develop this condition: Being older than 50. Pregnancy. Being a woman. In general, the condition is more common in women than in men. A family history of the condition. Having iron deficiency. Overuse of caffeine, nicotine, or alcohol. Certain medical conditions, such as kidney disease, Parkinson's disease, or nerve damage. Certain medicines, such as those for high blood pressure, nausea, colds, allergies, depression, and some heart conditions. What are the signs or symptoms? The main symptom of this condition is uncomfortable sensations in the legs, such as: Pulling. Tingling. Prickling. Throbbing. Crawling. Burning. Usually, the sensations: Affect both sides of the body. Are worse when you sit or lie down. Are worse at night. These may make it difficult to fall asleep. Make you have a strong urge to move your legs. Are temporarily relieved by moving your legs or standing. The arms can also be affected, but this is rare. People who have this condition often have tiredness during the day because of their lack of sleep at night. How is this diagnosed? This condition may be diagnosed based on: Your symptoms. Blood tests. In some cases, you may be monitored in a sleep lab by a specialist (a sleep study). This can detect any disruptions in your sleep. How is this treated? This condition is treated by managing the symptoms. This may include: Lifestyle changes, such as  exercising, using relaxation techniques, and avoiding caffeine, alcohol, or tobacco. Iron supplements. Medicines. Parkinson's medications may be tried first. Anti-seizure medications can also be helpful. Follow these instructions at home: General instructions Take over-the-counter and prescription medicines only as told by your health care provider. Use methods to help relieve the uncomfortable sensations, such as: Massaging your legs. Walking or stretching. Taking a cold or hot bath. Keep all follow-up visits. This is important. Lifestyle     Practice good sleep habits. For example, go to bed and get up at the same time every day. Most adults should get 7-9 hours of sleep each night. Exercise regularly. Try to get at least 30 minutes of exercise most days of the week. Practice ways of relaxing, such as yoga or meditation. Avoid caffeine and alcohol. Do not use any products that contain nicotine or tobacco. These products include cigarettes, chewing tobacco, and vaping devices, such as e-cigarettes. If you need help quitting, ask your health care provider. Where to find more information National Institute of Neurological Disorders and Stroke: www.ninds.nih.gov Contact a health care provider if: Your symptoms get worse or they do not improve with treatment. Summary Restless legs syndrome is a condition that causes uncomfortable feelings or sensations in the legs, especially while sitting or lying down. The symptoms often interfere with your ability to sleep. This condition is treated by managing the symptoms. You may need to make lifestyle changes or take medicines. This information is not intended to replace advice given to you by your health care provider. Make sure you discuss any questions you have with your health care provider. Document Revised: 11/11/2020 Document Reviewed: 11/11/2020 Elsevier Patient Education    2024 [employer]  

## 2022-11-11 ENCOUNTER — Ambulatory Visit
Admission: RE | Admit: 2022-11-11 | Discharge: 2022-11-11 | Disposition: A | Discharge status: Home / Self Care | Payer: PPO | Source: Ambulatory Visit | Attending: Nurse Practitioner | Admitting: Nurse Practitioner

## 2022-11-11 ENCOUNTER — Encounter: Payer: Self-pay | Admitting: Nurse Practitioner

## 2022-11-11 ENCOUNTER — Ambulatory Visit (INDEPENDENT_AMBULATORY_CARE_PROVIDER_SITE_OTHER): Payer: PPO | Admitting: Nurse Practitioner

## 2022-11-11 ENCOUNTER — Ambulatory Visit
Admission: RE | Admit: 2022-11-11 | Discharge: 2022-11-11 | Disposition: A | Discharge status: Home / Self Care | Payer: PPO | Attending: Nurse Practitioner | Admitting: Nurse Practitioner

## 2022-11-11 VITALS — BP 106/69 | HR 67 | Temp 98.4°F | Ht 64.02 in | Wt 108.0 lb

## 2022-11-11 DIAGNOSIS — Z0000 Encounter for general adult medical examination without abnormal findings: Secondary | ICD-10-CM

## 2022-11-11 DIAGNOSIS — G2581 Restless legs syndrome: Secondary | ICD-10-CM | POA: Diagnosis not present

## 2022-11-11 DIAGNOSIS — F5101 Primary insomnia: Secondary | ICD-10-CM

## 2022-11-11 DIAGNOSIS — Z9189 Other specified personal risk factors, not elsewhere classified: Secondary | ICD-10-CM

## 2022-11-11 DIAGNOSIS — J9811 Atelectasis: Secondary | ICD-10-CM | POA: Diagnosis not present

## 2022-11-11 DIAGNOSIS — G629 Polyneuropathy, unspecified: Secondary | ICD-10-CM

## 2022-11-11 DIAGNOSIS — R0781 Pleurodynia: Secondary | ICD-10-CM | POA: Diagnosis not present

## 2022-11-11 DIAGNOSIS — W19XXXA Unspecified fall, initial encounter: Secondary | ICD-10-CM | POA: Insufficient documentation

## 2022-11-11 DIAGNOSIS — R918 Other nonspecific abnormal finding of lung field: Secondary | ICD-10-CM | POA: Diagnosis not present

## 2022-11-11 DIAGNOSIS — S2232XA Fracture of one rib, left side, initial encounter for closed fracture: Secondary | ICD-10-CM | POA: Diagnosis not present

## 2022-11-11 DIAGNOSIS — G250 Essential tremor: Secondary | ICD-10-CM | POA: Diagnosis not present

## 2022-11-11 DIAGNOSIS — E7800 Pure hypercholesterolemia, unspecified: Secondary | ICD-10-CM

## 2022-11-11 DIAGNOSIS — Z23 Encounter for immunization: Secondary | ICD-10-CM

## 2022-11-11 NOTE — Progress Notes (Signed)
BP 106/69   Pulse 67   Temp 98.4 F (36.9 C) (Oral)   Ht 5' 4.02" (1.626 m)   Wt 108 lb (49 kg)   SpO2 97%   BMI 18.53 kg/m    Subjective:    Patient ID: Cassandra [name], female    DOB: [birth year], 82 y.o.   MRN: [id number]  HPI: Cassandra [name] is a 82 y.o. female presenting on 11/11/2022 for Medicare Wellness and follow-up visit. Current medical complaints include:recent fall on Friday  She currently lives with: Menopausal Symptoms: no  NEUROPATHY Follows with neurology, last visit on 08/07/22 -- they made changes to her Primidone with increase -- she reports not wanting to go higher as makes her off balance at times.  Continues on Magnesium and B12 + iron.  Had a fall on 11/06/22 when getting up to go to bathroom in evening.  Fell to right hit bath tub and commode, then fell to left and hit wall and commode. She finally was able to get on the commode.  Has not been off balance like this before and no falls like this in past.  Is having pain to left rib area and at times makes arm sore -- rib pain is a 8-9/10 with movement.  Pain just started last night.  Has not taken anything for this. Neuropathy status: stable  Satisfied with current treatment?: yes Medication side effects:  at times medication makes her off balance Medication compliance:  good compliance Location: bilateral lower legs - L>R Pain: yes Severity:  no specific pain, more numbness and loss of feeling   Quality:  tingling, creeping, and pins and needles Frequency: constant Bilateral: yes Symmetric: yes Numbness: yes Decreased sensation: yes Weakness: yes Context: stable Alleviating factors: nothing Aggravating factors: unknown Treatments attempted: Requip, Primidone  The ASCVD Risk score (Arnett DK, et al., 2019) failed to calculate for the following reasons:   The 2019 ASCVD risk score is only valid for ages 82 to 21   Depression Screen done today and results listed below:     11/11/2022    1:18  PM 07/22/2021    1:44 PM 10/12/2020   10:53 AM 06/08/2020   11:02 AM 06/06/2019    2:30 PM  Depression screen PHQ 2/9  Decreased Interest 0 0 0 1 0  Down, Depressed, Hopeless 0 0 0 3 0  PHQ - 2 Score 0 0 0 4 0  Altered sleeping 0 0  0   Tired, decreased energy 0 0  0   Change in appetite 0 0  0   Feeling bad or failure about yourself  0 0  0   Trouble concentrating 0 0  0   Moving slowly or fidgety/restless 0 0  0   Suicidal thoughts 0 0  0   PHQ-9 Score 0 0  4   Difficult doing work/chores Not difficult at all Not difficult at all         11/11/2022    1:17 PM 07/22/2021    1:44 PM  GAD 7 : Generalized Anxiety Score  Nervous, Anxious, on Edge 0 0  Control/stop worrying 0 0  Worry too much - different things 0 0  Trouble relaxing 0 0  Restless 0 0  Easily annoyed or irritable 0 0  Afraid - awful might happen 0 0  Total GAD 7 Score 0 0  Anxiety Difficulty Not difficult at all Not difficult at all      06/08/2020   11:02  AM 10/12/2020   10:53 AM 07/22/2021    1:44 PM 11/11/2022    1:10 PM 11/11/2022    1:23 PM  Fall Risk  Falls in the past year? 1 0 0 1 1  Was there an injury with Fall? 0 0 0 1 1  Fall Risk Category Calculator 1 0 0 3 2  Fall Risk Category (Retired) Low Low Low    (RETIRED) Patient Fall Risk Level   Low fall risk    Patient at Risk for Falls Due to  No Fall Risks No Fall Risks History of fall(s) Impaired balance/gait  Fall risk Follow up  Falls evaluation completed Falls evaluation completed Falls evaluation completed Falls evaluation completed;Falls prevention discussed    Functional Status Survey: Is the patient deaf or have difficulty hearing?: No Does the patient have difficulty seeing, even when wearing glasses/contacts?: No Does the patient have difficulty concentrating, remembering, or making decisions?: No Does the patient have difficulty walking or climbing stairs?: No Does the patient have difficulty dressing or bathing?: No Does the patient have  difficulty doing errands alone such as visiting a doctor's office or shopping?: No   Past Medical History:  Past Medical History:  Diagnosis Date   Cancer (HCC)    skin cancer   Gout    Hepatitis    40 years ago   Irritable bowel syndrome    history of....   Nerve damage of left foot     Surgical History:  Past Surgical History:  Procedure Laterality Date   ABDOMINAL HYSTERECTOMY     BREAST SURGERY     BIOPSY   BUNIONECTOMY Bilateral 10/25/2020   Procedure: Arbutus Leas;  Surgeon: Deeann Saint, MD;  Location: [facility] OR[facility];  Service: Orthopedics;  Laterality: Bilateral;   CATARACT EXTRACTION W/PHACO Left 11/13/2015   Procedure: CATARACT EXTRACTION PHACO AND INTRAOCULAR LENS PLACEMENT (IOC);  Surgeon: Galen Manila, MD;  Location: [facility];  Service: Ophthalmology;  Laterality: Left;  [place] 00:31 AP% 11.9 CDE 3.72 Fluid pack  lot # [id number]   CATARACT EXTRACTION W/PHACO Right 12/11/2015   Procedure: CATARACT EXTRACTION PHACO AND INTRAOCULAR LENS PLACEMENT (IOC);  Surgeon: Galen Manila, MD;  Location: [facility];  Service: Ophthalmology;  Laterality: Right;  Lot# [id number] H [place]: 00:42.4 AP%: 18.9 CDE:8.01    COLONOSCOPY     SKIN CANCER EXCISION     TONSILLECTOMY      Medications:  Current Outpatient Medications on File Prior to Visit  Medication Sig   Biotin 1 MG CAPS Take 1 mg by mouth daily.   Collagen-Vitamin C-Biotin (COLLAGEN 1500/C PO) Take 1 tablet by mouth daily.   ferrous sulfate 325 (65 FE) MG tablet Take 325 mg by mouth every other day.   ipratropium (ATROVENT) 0.03 % nasal spray Place 1 spray into both nostrils daily as needed for rhinitis.   Magnesium 300 MG CAPS Take by mouth.   Omega-3 Fatty Acids (FISH OIL) 600 MG CAPS Take 1 tablet by mouth daily.   primidone (MYSOLINE) 50 MG tablet Take by mouth.   vitamin B-12 (CYANOCOBALAMIN) 1000 MCG tablet Take 1,000 mcg by mouth every other day.   No current facility-administered medications on file prior to visit.     Allergies:  No Known Allergies  Social History:  Social History   Socioeconomic History   Marital status: Married    Spouse name: Not on file   Number of children: Not on file   Years of education: Not on file   Highest education level: Not on  file  Occupational History   Not on file  Tobacco Use   Smoking status: Never   Smokeless tobacco: Never  Vaping Use   Vaping status: Never Used  Substance and Sexual Activity   Alcohol use: No   Drug use: No   Sexual activity: Yes  Other Topics Concern   Not on file  Social History Narrative   Not on file   Social Determinants of Health   Financial Resource Strain: Low Risk  (11/11/2022)   Overall Financial Resource Strain (CARDIA)    Difficulty of Paying Living Expenses: Not hard at all  Food Insecurity: No Food Insecurity (11/11/2022)   Hunger Vital Sign    Worried About Running Out of Food in the Last Year: Never true    Ran Out of Food in the Last Year: Never true  Transportation Needs: No Transportation Needs (11/11/2022)   PRAPARE - Administrator, Civil Service (Medical): No    Lack of Transportation (Non-Medical): No  Physical Activity: Sufficiently Active (11/11/2022)   Exercise Vital Sign    Days of Exercise per Week: 5 days    Minutes of Exercise per Session: 30 min  Stress: No Stress Concern Present (11/11/2022)   [employer] of Occupational Health - Occupational Stress Questionnaire    Feeling of Stress : Not at all  Social Connections: Moderately Integrated (11/11/2022)   Social Connection and Isolation Panel [NHANES]    Frequency of Communication with Friends and Family: More than three times a week    Frequency of Social Gatherings with Friends and Family: More than three times a week    Attends Religious Services: More than 4 times per year    Active Member of [employer] or Organizations: No    Attends Banker Meetings: Never    Marital Status: Married  Catering manager Violence:  Not At Risk (11/11/2022)   Humiliation, Afraid, Rape, and Kick questionnaire    Fear of Current or Ex-Partner: No    Emotionally Abused: No    Physically Abused: No    Sexually Abused: No   Social History   Tobacco Use  Smoking Status Never  Smokeless Tobacco Never   Social History   Substance and Sexual Activity  Alcohol Use No    Family History:  Family History  Problem Relation Age of Onset   Kidney Stones Mother    Cancer Father    Cancer Sister        colon and ovarian    Past medical history, surgical history, medications, allergies, family history and social history reviewed with patient today and changes made to appropriate areas of the chart.   ROS All other ROS negative except what is listed above and in the HPI.      Objective:    BP 106/69   Pulse 67   Temp 98.4 F (36.9 C) (Oral)   Ht 5' 4.02" (1.626 m)   Wt 108 lb (49 kg)   SpO2 97%   BMI 18.53 kg/m   Wt Readings from Last 3 Encounters:  11/11/22 108 lb (49 kg)  10/14/21 111 lb (50.3 kg)  09/16/21 111 lb 12.8 oz (50.7 kg)    Physical Exam Vitals and nursing note reviewed. Exam conducted with a chaperone present.  Constitutional:      General: She is awake. She is not in acute distress.    Appearance: She is well-developed and well-groomed. She is not ill-appearing or toxic-appearing.  HENT:     Head:  Normocephalic and atraumatic.     Right Ear: Hearing, tympanic membrane, ear canal and external ear normal. No drainage.     Left Ear: Hearing, tympanic membrane, ear canal and external ear normal. No drainage.     Nose: Nose normal.     Right Sinus: No maxillary sinus tenderness or frontal sinus tenderness.     Left Sinus: No maxillary sinus tenderness or frontal sinus tenderness.     Mouth/Throat:     Mouth: Mucous membranes are moist.     Pharynx: Oropharynx is clear. Uvula midline. No pharyngeal swelling, oropharyngeal exudate or posterior oropharyngeal erythema.  Eyes:     General: Lids  are normal.        Right eye: No discharge.        Left eye: No discharge.     Extraocular Movements: Extraocular movements intact.     Conjunctiva/sclera: Conjunctivae normal.     Pupils: Pupils are equal, round, and reactive to light.     Visual Fields: Right eye visual fields normal and left eye visual fields normal.  Neck:     Thyroid: No thyromegaly.     Vascular: No carotid bruit.     Trachea: Trachea normal.  Cardiovascular:     Rate and Rhythm: Normal rate and regular rhythm.     Heart sounds: Normal heart sounds. No murmur heard.    No gallop.  Pulmonary:     Effort: Pulmonary effort is normal. No accessory muscle usage or respiratory distress.     Breath sounds: Normal breath sounds. No decreased breath sounds, wheezing or rhonchi.     Comments: Some tenderness on palpation of mid left rib area.  No bruising noted or edema. Abdominal:     General: Bowel sounds are normal.     Palpations: Abdomen is soft. There is no hepatomegaly or splenomegaly.     Tenderness: There is no abdominal tenderness.  Musculoskeletal:        General: Normal range of motion.     Cervical back: Normal range of motion and neck supple.     Right lower leg: No edema.     Left lower leg: No edema.  Lymphadenopathy:     Head:     Right side of head: No submental, submandibular, tonsillar, preauricular or posterior auricular adenopathy.     Left side of head: No submental, submandibular, tonsillar, preauricular or posterior auricular adenopathy.     Cervical: No cervical adenopathy.  Skin:    General: Skin is warm and dry.     Capillary Refill: Capillary refill takes less than 2 seconds.     Findings: No rash.  Neurological:     Mental Status: She is alert and oriented to person, place, and time.     Gait: Gait is intact.     Deep Tendon Reflexes: Reflexes are normal and symmetric.     Reflex Scores:      Brachioradialis reflexes are 2+ on the right side and 2+ on the left side.      Patellar  reflexes are 2+ on the right side and 2+ on the left side. Psychiatric:        Attention and Perception: Attention normal.        Mood and Affect: Mood normal.        Speech: Speech normal.        Behavior: Behavior normal. Behavior is cooperative.        Thought Content: Thought content normal.  Judgment: Judgment normal.       11/11/2022    1:26 PM  6CIT Screen  What Year? 0 points  What month? 0 points  What time? 0 points  Count back from 20 0 points  Months in reverse 0 points  Repeat phrase 0 points  Total Score 0 points   Results for orders placed or performed in visit on 10/12/20  Cdiff NAA+O+P+Stool Culture   Specimen: Stool   ST  Result Value Ref Range   Salmonella/Shigella Screen Final report    Stool Culture result 1 (RSASHR) Comment    Campylobacter Culture Final report    Stool Culture result 1 (CMPCXR) Comment    E coli, Shiga toxin Assay Negative Negative   OVA + PARASITE EXAM Final report    O&P result 1 Comment    Toxigenic C. Difficile by PCR Negative Negative  Basic Metabolic Panel (BMET)  Result Value Ref Range   Glucose 82 65 - 99 mg/dL   BUN 19 8 - 27 mg/dL   Creatinine, Ser 1.61 0.57 - 1.00 mg/dL   eGFR 75 >09 UE/AVW/0.98   BUN/Creatinine Ratio 24 12 - 28   Sodium 143 134 - 144 mmol/L   Potassium 4.3 3.5 - 5.2 mmol/L   Chloride 107 (H) 96 - 106 mmol/L   CO2 20 20 - 29 mmol/L   Calcium 9.0 8.7 - 10.3 mg/dL  CBC with Differential  Result Value Ref Range   WBC 4.3 3.4 - 10.8 x10E3/uL   RBC 5.24 3.77 - 5.28 x10E6/uL   Hemoglobin 14.5 11.1 - 15.9 g/dL   Hematocrit 11.9 14.7 - 46.6 %   MCV 87 79 - 97 fL   MCH 27.7 26.6 - 33.0 pg   MCHC 31.9 31.5 - 35.7 g/dL   RDW 82.9 56.2 - 13.0 %   Platelets 249 150 - 450 x10E3/uL   Neutrophils 57 Not Estab. %   Lymphs 34 Not Estab. %   Monocytes 6 Not Estab. %   Eos 2 Not Estab. %   Basos 1 Not Estab. %   Neutrophils Absolute 2.5 1.4 - 7.0 x10E3/uL   Lymphocytes Absolute 1.5 0.7 - 3.1 x10E3/uL    Monocytes Absolute 0.3 0.1 - 0.9 x10E3/uL   EOS (ABSOLUTE) 0.1 0.0 - 0.4 x10E3/uL   Basophils Absolute 0.0 0.0 - 0.2 x10E3/uL   Immature Granulocytes 0 Not Estab. %   Immature Grans (Abs) 0.0 0.0 - 0.1 x10E3/uL      Assessment & Plan:   Problem List Items Addressed This Visit       Nervous and Auditory   Benign essential tremor    Chronic, ongoing, followed by neurology.  Continue current medication regimen as prescribed by them, recent notes reviewed.      Sensorimotor neuropathy    Chronic, ongoing, followed by neurology (Dr. Sherryll Burger).  Continue this collaboration and current medication regimen as prescribed by him.  Obtain labs today: CBC, CMP, TSH.  Recommend she discuss with Dr. Sherryll Burger starting some PT to work on gait and balance for fall prevention. Return in one year.      Relevant Orders   CBC with Differential/Platelet   TSH     Other   At risk for loss of bone density    Ordered repeat DEXA and recommend she call to schedule, especially after recent fall.      Relevant Orders   DG Bone Density   Elevated low density lipoprotein (LDL) cholesterol level    Noted on  past labs, check lipid panel today and continue focus on healthy diet and regular activity.  Due to age and overall healthy lifestyle will avoid starting statin at this time, discussed with patient and she prefers not to start.      Relevant Orders   Comprehensive metabolic panel   Lipid Panel w/o Chol/HDL Ratio   Fall    On 11/06/22 with current left rib pain.  Will obtain imaging, suspect fractures present.  Recommend she take Tylenol as needed, up to 3000 MG total a day.  May use Icy/Hot or Voltaren gel to area.  Support area when coughing or sneezing.  Ensure to do deep breathing exercises ever 4 hours while awake to prevent PNA.  Recommend she discuss with Dr. Sherryll Burger starting some PT to work on gait and balance for fall prevention.       Relevant Orders   DG Ribs Unilateral Left   Restless leg syndrome     Chronic, ongoing.  Followed by neurology.  Continue Primidone at this time, as prescribed by them.  Check labs today.  Adjust plan of care as needed.  Continue collaboration with neurology, recent notes reviewed.      Relevant Orders   CBC with Differential/Platelet   TSH   Other Visit Diagnoses     Medicare annual wellness visit, initial    -  Primary   Medicare Wellness due and performed with patient today -- reviewed health maintenance. Refuses all vaccinations.        Follow up plan: Return in about 1 year (around 11/11/2023) for Annual Exam.   LABORATORY TESTING:  - Pap smear: not applicable  IMMUNIZATIONS:   - Tdap: Tetanus vaccination status reviewed: last tetanus booster within 10 years. - Influenza: Up to date - Pneumovax: Refused - Prevnar: Refused - COVID: Refused - HPV: Not applicable - Shingrix vaccine: Refused  SCREENING: -Mammogram: Refused  - Colonoscopy: Not applicable  - Bone Density: Ordered today  -Hearing Test: Not applicable  -Spirometry: Not applicable   PATIENT COUNSELING:   Advised to take 1 mg of folate supplement per day if capable of pregnancy.   Sexuality: Discussed sexually transmitted diseases, partner selection, use of condoms, avoidance of unintended pregnancy  and contraceptive alternatives.   Advised to avoid cigarette smoking.  I discussed with the patient that most people either abstain from alcohol or drink within safe limits (<=14/week and <=4 drinks/occasion for males, <=7/weeks and <= 3 drinks/occasion for females) and that the risk for alcohol disorders and other health effects rises proportionally with the number of drinks per week and how often a drinker exceeds daily limits.  Discussed cessation/primary prevention of drug use and availability of treatment for abuse.   Diet: Encouraged to adjust caloric intake to maintain  or achieve ideal body weight, to reduce intake of dietary saturated fat and total fat, to limit  sodium intake by avoiding high sodium foods and not adding table salt, and to maintain adequate dietary potassium and calcium preferably from fresh fruits, vegetables, and low-fat dairy products.    Stressed the importance of regular exercise  Injury prevention: Discussed safety belts, safety helmets, smoke detector, smoking near bedding or upholstery.   Dental health: Discussed importance of regular tooth brushing, flossing, and dental visits.    NEXT PREVENTATIVE PHYSICAL DUE IN 1 YEAR. Return in about 1 year (around 11/11/2023) for Annual Exam.

## 2022-11-11 NOTE — Assessment & Plan Note (Signed)
Ordered repeat DEXA and recommend she call to schedule, especially after recent fall.

## 2022-11-11 NOTE — Assessment & Plan Note (Signed)
Noted on past labs, check lipid panel today and continue focus on healthy diet and regular activity.  Due to age and overall healthy lifestyle will avoid starting statin at this time, discussed with patient and she prefers not to start.

## 2022-11-11 NOTE — Assessment & Plan Note (Addendum)
Chronic, ongoing, followed by neurology.  Continue current medication regimen as prescribed by them, recent notes reviewed.

## 2022-11-11 NOTE — Assessment & Plan Note (Signed)
Chronic, ongoing.  Followed by neurology.  Continue Primidone at this time, as prescribed by them.  Check labs today.  Adjust plan of care as needed.  Continue collaboration with neurology, recent notes reviewed.

## 2022-11-11 NOTE — Assessment & Plan Note (Signed)
Chronic, ongoing, followed by neurology (Dr. Sherryll Burger).  Continue this collaboration and current medication regimen as prescribed by him.  Obtain labs today: CBC, CMP, TSH.  Recommend she discuss with Dr. Sherryll Burger starting some PT to work on gait and balance for fall prevention. Return in one year.

## 2022-11-11 NOTE — Assessment & Plan Note (Signed)
On 11/06/22 with current left rib pain.  Will obtain imaging, suspect fractures present.  Recommend she take Tylenol as needed, up to 3000 MG total a day.  May use Icy/Hot or Voltaren gel to area.  Support area when coughing or sneezing.  Ensure to do deep breathing exercises ever 4 hours while awake to prevent PNA.  Recommend she discuss with Dr. Sherryll Burger starting some PT to work on gait and balance for fall prevention.

## 2022-11-12 NOTE — Progress Notes (Signed)
Contacted via MyChart The ASCVD Risk score (Arnett DK, et al., 2019) failed to calculate for the following reasons:   The 2019 ASCVD risk score is only valid for ages 72 to 73   Good afternoon Cassandra [name], your labs have returned and overall look great with exception of cholesterol levels.  These remain elevated, however at 82 I believe in less medication is better, if you would like to start something to lower levels let me know.  Your LDL is above normal. The LDL is the bad cholesterol. Over time and in combination with inflammation and other factors, this contributes to plaque which in turn may lead to stroke and/or heart attack down the road. Sometimes high LDL is primarily genetic, and people might be eating all the right foods but still have high numbers. Other times, there is room for improvement in one's diet and eating healthier can bring this number down and potentially reduce one's risk of heart attack and/or stroke.   To reduce your LDL, Remember - more fruits and vegetables, more fish, and limit red meat and dairy products. More soy, nuts, beans, barley, lentils, oats and plant sterol ester enriched margarine instead of butter. I also encourage eliminating sugar and processed food. Remember, shop on the outside of the grocery store and visit your [employer]. Any questions? Keep being amazing!!  Thank you for allowing me to participate in your care.  I appreciate you. Kindest regards, Marilynne Dupuis

## 2022-11-17 NOTE — Progress Notes (Signed)
Contacted via MyChart -- however please call to ensure she received:) Good morning Krystiana, your imaging has returned and there is a rib fracture to left seventh rib slightly to posterior and side aspect, this explains your discomfort.  For this definitely continue to take Tylenol or Ibuprofen as needed + I recommend use of Icy/Hot lidocaine patches and ensuring she is doing deep breathing exercises regularly as we discussed.  If any worsening symptoms return to office.  These will heal on their own over time:)

## 2022-12-30 ENCOUNTER — Ambulatory Visit: Payer: Self-pay | Admitting: *Deleted

## 2022-12-30 NOTE — Telephone Encounter (Signed)
Summary: Urinary incontinence   Per agent: "The patient called in stating she has been unable to control her bladder for quite sometime now. She says it also seems like it is getting worse now. Please assist patient further."      Chief Complaint: Incontinence. Symptoms: Urinary incontinence for months, worsening. "No control, leaks, have to wear a pad." No 'Leakage" at HS. Frequency: Months Pertinent Negatives: Patient denies dysuria Disposition: [] ED /[] Urgent Care (no appt availability in office) / [x] Appointment(In office/virtual)/ []  [place] [facility]/ [] Home Care/ [] Refused Recommended Disposition /[] [place] [facility]/ []  Follow-up with PCP Additional Notes: Appt secured for tomorrow. Care advise provided, pt verbalizes understanding.  Reason for Disposition  [1] Can't control passage of urine (i.e., urinary incontinence, wetting self) AND [2] present > 2 weeks  Answer Assessment - Initial Assessment Questions 1. SYMPTOM: "What's the main symptom you're concerned about?" (e.g., frequency, incontinence)     Incontinence., Urgency 2. ONSET: "When did the    start?"     Months ago 3. PAIN: "Is there any pain?" If Yes, ask: "How bad is it?" (Scale: 1-10; mild, moderate, severe)     No 4. CAUSE: "What do you think is causing the symptoms?"     Unsure 5. OTHER SYMPTOMS: "Do you have any other symptoms?" (e.g., blood in urine, fever, flank pain, pain with urination)     None  Protocols used: Urinary Symptoms-A-AH

## 2022-12-31 ENCOUNTER — Encounter: Payer: Self-pay | Admitting: Nurse Practitioner

## 2022-12-31 ENCOUNTER — Ambulatory Visit (INDEPENDENT_AMBULATORY_CARE_PROVIDER_SITE_OTHER): Payer: PPO | Admitting: Nurse Practitioner

## 2022-12-31 VITALS — BP 118/63 | HR 50 | Temp 98.6°F | Ht 64.0 in | Wt 106.8 lb

## 2022-12-31 DIAGNOSIS — N39498 Other specified urinary incontinence: Secondary | ICD-10-CM | POA: Diagnosis not present

## 2022-12-31 DIAGNOSIS — R159 Full incontinence of feces: Secondary | ICD-10-CM | POA: Diagnosis not present

## 2022-12-31 DIAGNOSIS — R32 Unspecified urinary incontinence: Secondary | ICD-10-CM | POA: Insufficient documentation

## 2022-12-31 LAB — URINALYSIS, ROUTINE W REFLEX MICROSCOPIC
Bilirubin, UA: NEGATIVE
Glucose, UA: NEGATIVE
Ketones, UA: NEGATIVE
Nitrite, UA: NEGATIVE
Protein,UA: NEGATIVE
Specific Gravity, UA: 1.025 (ref 1.005–1.030)
Urobilinogen, Ur: 0.2 mg/dL (ref 0.2–1.0)
pH, UA: 6 (ref 5.0–7.5)

## 2022-12-31 LAB — MICROSCOPIC EXAMINATION: Bacteria, UA: NONE SEEN

## 2022-12-31 MED ORDER — SOLIFENACIN SUCCINATE 5 MG PO TABS: 5.0000 mg | ORAL_TABLET | Freq: Every day | ORAL | 2 refills | Status: DC

## 2022-12-31 NOTE — Progress Notes (Signed)
BP 118/63   Pulse (!) 50   Temp 98.6 F (37 C) (Oral)   Ht 5\' 4"  (1.626 m)   Wt 106 lb 12.8 oz (48.4 kg)   SpO2 97%   BMI 18.33 kg/m    Subjective:    Patient ID: Cassandra [name], female    DOB: 07/09/1940, 82 y.o.   MRN: [id number]  HPI: Cassandra [name] is a 82 y.o. female  Chief Complaint  Patient presents with   Urinary Incontinence    Patient states she has been having urinary incontinence for the last few months. States she seems that it has become more frequent lately. States she cannot hold her urine and when she has to go, she has to go right then.    URINARY INCONTINENCE Been coming on gradually, more so over last few months.  More frequent issues recently.  Is having issues with both bowel and urinary incontinence. Years ago went to a provider for similar issue of urinary incontinence.  Had hysterectomy in past. Sometimes at present can make it to toilet at times, if at home.  If out and about the issue becomes a problem.  She never knows, at times no warnings.    Has 3 children all vaginal births, no larger weights for children.   Dysuria: no Urinary frequency: yes Urgency: yes Small volume voids: no -- larger amounts Symptom severity: yes Urinary incontinence: yes Foul odor: no Hematuria: no Abdominal pain: no Back pain: no Suprapubic pain/pressure: no Flank pain: no Fever:  no Vomiting: no Status: worse Previous urinary tract infection: no Recurrent urinary tract infection: no Sexual activity: No sexually active History of sexually transmitted disease: no Treatments attempted: none    Relevant past medical, surgical, family and social history reviewed and updated as indicated. Interim medical history since our last visit reviewed. Allergies and medications reviewed and updated.  Review of Systems  Constitutional:  Negative for activity change, appetite change, diaphoresis, fatigue and fever.  Respiratory:  Negative for cough, chest tightness,  shortness of breath and wheezing.   Cardiovascular:  Negative for chest pain, palpitations and leg swelling.  Gastrointestinal:  Negative for abdominal distention, abdominal pain, constipation, diarrhea, nausea and vomiting.       Fecal incontinence  Genitourinary:        Urinary incontinence  Neurological: Negative.   Psychiatric/Behavioral: Negative.     Per HPI unless specifically indicated above     Objective:    BP 118/63   Pulse (!) 50   Temp 98.6 F (37 C) (Oral)   Ht 5\' 4"  (1.626 m)   Wt 106 lb 12.8 oz (48.4 kg)   SpO2 97%   BMI 18.33 kg/m   Wt Readings from Last 3 Encounters:  12/31/22 106 lb 12.8 oz (48.4 kg)  11/11/22 108 lb (49 kg)  10/14/21 111 lb (50.3 kg)    Physical Exam Vitals and nursing note reviewed.  Constitutional:      General: She is awake. She is not in acute distress.    Appearance: Normal appearance. She is well-developed and well-groomed. She is not ill-appearing or toxic-appearing.  HENT:     Head: Normocephalic.     Right Ear: Hearing and external ear normal.     Left Ear: Hearing and external ear normal.  Eyes:     General: Lids are normal.        Right eye: No discharge.        Left eye: No discharge.  Conjunctiva/sclera: Conjunctivae normal.     Pupils: Pupils are equal, round, and reactive to light.  Neck:     Thyroid: No thyromegaly.     Vascular: No carotid bruit.  Cardiovascular:     Rate and Rhythm: Regular rhythm. Bradycardia present.     Heart sounds: Normal heart sounds. No murmur heard.    No gallop.  Pulmonary:     Effort: Pulmonary effort is normal. No accessory muscle usage or respiratory distress.     Breath sounds: Normal breath sounds.  Abdominal:     General: Bowel sounds are normal. There is no distension.     Palpations: Abdomen is soft.     Tenderness: There is no abdominal tenderness.  Musculoskeletal:     Cervical back: Normal range of motion and neck supple.     Right lower leg: No edema.     Left  lower leg: No edema.  Lymphadenopathy:     Cervical: No cervical adenopathy.  Skin:    General: Skin is warm and dry.  Neurological:     Mental Status: She is alert and oriented to person, place, and time.     Deep Tendon Reflexes: Reflexes are normal and symmetric.     Reflex Scores:      Brachioradialis reflexes are 2+ on the right side and 2+ on the left side.      Patellar reflexes are 2+ on the right side and 2+ on the left side. Psychiatric:        Attention and Perception: Attention normal.        Mood and Affect: Mood normal.        Speech: Speech normal.        Behavior: Behavior normal. Behavior is cooperative.        Thought Content: Thought content normal.    Results for orders placed or performed in visit on 11/11/22  CBC with Differential/Platelet  Result Value Ref Range   WBC 6.8 3.4 - 10.8 x10E3/uL   RBC 4.80 3.77 - 5.28 x10E6/uL   Hemoglobin 14.4 11.1 - 15.9 g/dL   Hematocrit 27.2 53.6 - 46.6 %   MCV 88 79 - 97 fL   MCH 30.0 26.6 - 33.0 pg   MCHC 34.3 31.5 - 35.7 g/dL   RDW 64.4 03.4 - 74.2 %   Platelets 275 150 - 450 x10E3/uL   Neutrophils 69 Not Estab. %   Lymphs 24 Not Estab. %   Monocytes 6 Not Estab. %   Eos 1 Not Estab. %   Basos 0 Not Estab. %   Neutrophils Absolute 4.7 1.4 - 7.0 x10E3/uL   Lymphocytes Absolute 1.6 0.7 - 3.1 x10E3/uL   Monocytes Absolute 0.4 0.1 - 0.9 x10E3/uL   EOS (ABSOLUTE) 0.1 0.0 - 0.4 x10E3/uL   Basophils Absolute 0.0 0.0 - 0.2 x10E3/uL   Immature Granulocytes 0 Not Estab. %   Immature Grans (Abs) 0.0 0.0 - 0.1 x10E3/uL  Comprehensive metabolic panel  Result Value Ref Range   Glucose 89 70 - 99 mg/dL   BUN 13 8 - 27 mg/dL   Creatinine, Ser 5.95 0.57 - 1.00 mg/dL   eGFR 73 >63 OV/FIE/3.32   BUN/Creatinine Ratio 16 12 - 28   Sodium 139 134 - 144 mmol/L   Potassium 4.2 3.5 - 5.2 mmol/L   Chloride 101 96 - 106 mmol/L   CO2 23 20 - 29 mmol/L   Calcium 9.6 8.7 - 10.3 mg/dL   Total Protein 6.4 6.0 - 8.5  g/dL   Albumin 4.5  3.8 - 4.8 g/dL   Globulin, Total 1.9 1.5 - 4.5 g/dL   Bilirubin Total <4.0 0.0 - 1.2 mg/dL   Alkaline Phosphatase 99 44 - 121 IU/L   AST 30 0 - 40 IU/L   ALT 20 0 - 32 IU/L  Lipid Panel w/o Chol/HDL Ratio  Result Value Ref Range   Cholesterol, Total 203 (H) 100 - 199 mg/dL   Triglycerides 99 0 - 149 mg/dL   HDL 76 >98 mg/dL   VLDL Cholesterol Cal 17 5 - 40 mg/dL   LDL Chol Calc (NIH) 119 (H) 0 - 99 mg/dL  TSH  Result Value Ref Range   TSH 1.940 0.450 - 4.500 uIU/mL      Assessment & Plan:   Problem List Items Addressed This Visit       Other   Absence of bladder continence - Primary    Ongoing for several months, along with occasional fecal incontinence.  Suspect some pelvic floor dysfunction is present.  UA overall reassuring today.  Having some issues with OAB, will trial low dose of Vesicare 5 MG daily to see if benefit for this and monitor for ADR.  Myrbetriq not covered.  Educated her on medication and side effects to monitor for.  Educated her on pelvic floor dysfunction.  Referral to pelvic floor therapy placed.  If no benefit then will send to urogyn.      Relevant Medications   solifenacin (VESICARE) 5 MG tablet   Other Relevant Orders   Urinalysis, Routine w reflex microscopic   Ambulatory referral to Physical Therapy   Incontinence of feces    Ongoing for several months, along with urinary incontinence.  Suspect some pelvic floor dysfunction is present.  UA overall reassuring today.  Having some issues with OAB, will trial low dose of Vesicare 5 MG daily to see if benefit for this and monitor for ADR.  Myrbetriq not covered.  Educated her on medication and side effects to monitor for.  Educated her on pelvic floor dysfunction.  Referral to pelvic floor therapy placed.  If no benefit then will send to urogyn.        Follow up plan: Return in about 5 weeks (around 02/04/2023) for Pelvic Floor Dysfunction.

## 2022-12-31 NOTE — Assessment & Plan Note (Signed)
Ongoing for several months, along with occasional fecal incontinence.  Suspect some pelvic floor dysfunction is present.  UA overall reassuring today.  Having some issues with OAB, will trial low dose of Vesicare 5 MG daily to see if benefit for this and monitor for ADR.  Myrbetriq not covered.  Educated her on medication and side effects to monitor for.  Educated her on pelvic floor dysfunction.  Referral to pelvic floor therapy placed.  If no benefit then will send to urogyn.

## 2022-12-31 NOTE — Patient Instructions (Signed)
Pelvic Floor Dysfunction, Female  Pelvic floor dysfunction (PFD) is a condition that results when the group of muscles and connective tissues that support the organs in the pelvis (pelvic floor muscles) do not work well. These muscles and their connections form a sling that supports the colon and bladder. In women, they also support the uterus. PFD causes pelvic floor muscles to be too weak, too tight, or both. In PFD, muscle movements are not coordinated. This may cause bowel or bladder problems. It may also cause pain. What are the causes? This condition may be caused by an injury to the pelvic area or by a weakening of pelvic muscles. This often results from pregnancy and childbirth or other types of strain. In many cases, the exact cause is not known. What increases the risk? The following factors may make you more likely to develop this condition: Having chronic bladder tissue inflammation (interstitial cystitis). Being an older person. Being overweight. History of radiation treatment for cancer in the pelvic region. Previous pelvic surgery, such as removal of the uterus (hysterectomy). What are the signs or symptoms? Symptoms of this condition vary and may include: Bladder symptoms, such as: Trouble starting urination and emptying the bladder. Frequent urinary tract infections. Leaking urine when coughing, laughing, or exercising (stress incontinence). Having to pass urine urgently or frequently. Pain when passing urine. Bowel symptoms, such as: Constipation. Urgent or frequent bowel movements. Incomplete bowel movements. Painful bowel movements. Leaking stool or gas. Unexplained genital or rectal pain. Genital or rectal muscle spasms. Low back pain. Other symptoms may include: A heavy, full, or aching feeling in the vagina. A bulge that protrudes into the vagina. Pain during or after sex. How is this diagnosed? This condition may be diagnosed based on: Your symptoms and  medical history. A physical exam. During the exam, your health care provider may check your pelvic muscles for tightness, spasm, pain, or weakness. This may include a rectal exam and a pelvic exam. In some cases, you may have diagnostic tests, such as: Electrical muscle function tests. Urine flow testing. X-ray tests of bowel function. Ultrasound of the pelvic organs. How is this treated? Treatment for this condition depends on the symptoms. Treatment options include: Physical therapy. This may include Kegel exercises to help relax or strengthen the pelvic floor muscles. Biofeedback. This type of therapy provides feedback on how tight your pelvic floor muscles are so that you can learn to control them. Internal or external massage therapy. A treatment that involves electrical stimulation of the pelvic floor muscles to help control pain (transcutaneous electrical nerve stimulation, or TENS). Sound wave therapy (ultrasound) to reduce muscle spasms. Medicines, such as: Muscle relaxants. Bladder control medicines. Surgery to reconstruct or support pelvic floor muscles may be an option if other treatments do not help. Follow these instructions at home: Activity Do your usual activities as told by your health care provider. Ask your health care provider if you should modify any activities. Do pelvic floor strengthening or relaxing exercises at home as told by your physical therapist. Lifestyle Maintain a healthy weight. Eat foods that are high in fiber, such as beans, whole grains, and fresh fruits and vegetables. Limit foods that are high in fat and processed sugars, such as fried or sweet foods. Manage stress with relaxation techniques such as yoga or meditation. General instructions If you have problems with leakage: Use absorbable pads or wear padded underwear. Wash frequently with mild soap. Keep your genital and anal area as clean and dry as  possible. Ask your health care provider if  you should try a barrier cream to prevent skin irritation. Take warm baths to relieve pelvic muscle tension or spasms. Take over-the-counter and prescription medicines only as told by your health care provider. Keep all follow-up visits. How is this prevented? The cause of PFD is not always known, but there are a few things you can do to reduce the risk of developing this condition, including: Staying at a healthy weight. Getting regular exercise. Managing stress. Contact a health care provider if: Your symptoms are not improving with home care. You have signs or symptoms of PFD that get worse at home. You develop new signs or symptoms. You have signs of a urinary tract infection, such as: Fever. Chills. Increased urinary frequency. A burning feeling when urinating. You have not had a bowel movement in 3 days (constipation). Summary Pelvic floor dysfunction results when the muscles and connective tissues in your pelvic floor do not work well. These muscles and their connections form a sling that supports your colon and bladder. In women, they also support the uterus. PFD may be caused by an injury to the pelvic area or by a weakening of pelvic muscles. PFD causes pelvic floor muscles to be too weak, too tight, or a combination of both. Symptoms may vary from person to person. In most cases, PFD can be treated with physical therapies and medicines. Surgery may be an option if other treatments do not help. This information is not intended to replace advice given to you by your health care provider. Make sure you discuss any questions you have with your health care provider. Document Revised: 08/08/2020 Document Reviewed: 08/08/2020 Elsevier Patient Education  2024 [employer].

## 2022-12-31 NOTE — Assessment & Plan Note (Signed)
Ongoing for several months, along with urinary incontinence.  Suspect some pelvic floor dysfunction is present.  UA overall reassuring today.  Having some issues with OAB, will trial low dose of Vesicare 5 MG daily to see if benefit for this and monitor for ADR.  Myrbetriq not covered.  Educated her on medication and side effects to monitor for.  Educated her on pelvic floor dysfunction.  Referral to pelvic floor therapy placed.  If no benefit then will send to urogyn.

## 2023-01-05 ENCOUNTER — Telehealth: Payer: Self-pay | Admitting: Nurse Practitioner

## 2023-01-05 NOTE — Telephone Encounter (Signed)
Patient called and request Dr Harvest Dark cancel the PT order for therapy at the hospital 5w2x as she is taking medication that seems to be working pretty good.

## 2023-01-07 DIAGNOSIS — G250 Essential tremor: Secondary | ICD-10-CM | POA: Diagnosis not present

## 2023-01-07 DIAGNOSIS — G629 Polyneuropathy, unspecified: Secondary | ICD-10-CM | POA: Diagnosis not present

## 2023-01-07 DIAGNOSIS — R2689 Other abnormalities of gait and mobility: Secondary | ICD-10-CM | POA: Diagnosis not present

## 2023-02-04 ENCOUNTER — Ambulatory Visit: Payer: PPO | Admitting: Nurse Practitioner

## 2023-03-02 DIAGNOSIS — H35372 Puckering of macula, left eye: Secondary | ICD-10-CM | POA: Diagnosis not present

## 2023-03-02 DIAGNOSIS — Z961 Presence of intraocular lens: Secondary | ICD-10-CM | POA: Diagnosis not present

## 2023-03-02 DIAGNOSIS — H43813 Vitreous degeneration, bilateral: Secondary | ICD-10-CM | POA: Diagnosis not present

## 2023-03-16 ENCOUNTER — Other Ambulatory Visit: Payer: Self-pay | Admitting: Nurse Practitioner

## 2023-03-18 ENCOUNTER — Telehealth: Payer: Self-pay | Admitting: Nurse Practitioner

## 2023-03-18 NOTE — Telephone Encounter (Signed)
Requested Prescriptions  Pending Prescriptions Disp Refills   solifenacin (VESICARE) 5 MG tablet [Pharmacy Med Name: SOLIFENACIN SUCCINATE 5 MG TAB] 30 tablet 2    Sig: TAKE 1 TABLET BY MOUTH DAILY     Urology:  Bladder Agents 2 Passed - 03/16/2023  8:16 AM      Passed - Cr in normal range and within 360 days    Creatinine, Ser  Date Value Ref Range Status  11/11/2022 0.81 0.57 - 1.00 mg/dL Final         Passed - ALT in normal range and within 360 days    ALT  Date Value Ref Range Status  11/11/2022 20 0 - 32 IU/L Final         Passed - AST in normal range and within 360 days    AST  Date Value Ref Range Status  11/11/2022 30 0 - 40 IU/L Final         Passed - Valid encounter within last 12 months    Recent Outpatient Visits           2 months ago Other urinary incontinence   [facility] [facility] [place], [place] T, NP   4 months ago Medicare annual wellness visit, initial   [place] [facility] [place], [place] T, NP   1 year ago Acute pain of right knee   [place] [facility] Mecum, Oswaldo Conroy, PA-C   2 years ago Diarrhea, unspecified type   [place] [facility] McElwee, Lauren A, NP   2 years ago Swelling of digit of right hand   [place] [facility] McElwee, Jake Church, NP       Future Appointments             In 8 months Cannady, Dorie Rank, NP [facility], [facility]

## 2023-03-18 NOTE — Telephone Encounter (Unsigned)
Copied from CRM [id number]. Topic: General - Other >> Mar 18, 2023 10:35 AM Everette C wrote: Reason for CRM: Medication Refill - Most Recent Primary Care Visit:  Provider: Aura Dials T Department: [facility] Visit Type: OFFICE VISIT Date: 12/31/2022  Medication: solifenacin (VESICARE) 5 MG tablet [[id number]]  Has the patient contacted their pharmacy? Yes. The patient shares that their pharmacy has attempted to contacted the practice for the previous 2 days  (Agent: If no, request that the patient contact the pharmacy for the refill. If patient does not wish to contact the pharmacy document the reason why and proceed with request.) (Agent: If yes, when and what did the pharmacy advise?)  Is this the correct pharmacy for this prescription? Yes If no, delete pharmacy and type the correct one.  This is the patient's preferred pharmacy:  [facility] - [place], [place] - [address]er [place] [address] Phone: [phone] Fax: [phone]  [id number] - [place], [place] - [address] [place] [address] Phone: [phone] Fax: [phone]   Has the prescription been filled recently? Yes  Is the patient out of the medication? Yes  Has the patient been seen for an appointment in the last year OR does the patient have an upcoming appointment? Yes  Can we respond through MyChart? No  Agent: Please be advised that Rx refills may take up to 3 business days. We ask that you follow-up with your pharmacy.

## 2023-04-30 DIAGNOSIS — K219 Gastro-esophageal reflux disease without esophagitis: Secondary | ICD-10-CM | POA: Diagnosis not present

## 2023-04-30 DIAGNOSIS — J0100 Acute maxillary sinusitis, unspecified: Secondary | ICD-10-CM | POA: Diagnosis not present

## 2023-05-07 DIAGNOSIS — G629 Polyneuropathy, unspecified: Secondary | ICD-10-CM | POA: Diagnosis not present

## 2023-05-07 DIAGNOSIS — G250 Essential tremor: Secondary | ICD-10-CM | POA: Diagnosis not present

## 2023-05-07 DIAGNOSIS — R2689 Other abnormalities of gait and mobility: Secondary | ICD-10-CM | POA: Diagnosis not present

## 2023-05-09 ENCOUNTER — Other Ambulatory Visit: Payer: Self-pay | Admitting: Nurse Practitioner

## 2023-05-11 NOTE — Telephone Encounter (Signed)
Requested Prescriptions  Refused Prescriptions Disp Refills   solifenacin (VESICARE) 5 MG tablet [Pharmacy Med Name: SOLIFENACIN SUCCINATE 5 MG TAB] 30 tablet 2    Sig: TAKE 1 TABLET BY MOUTH DAILY     Urology:  Bladder Agents 2 Passed - 05/11/2023  4:53 PM      Passed - Cr in normal range and within 360 days    Creatinine, Ser  Date Value Ref Range Status  11/11/2022 0.81 0.57 - 1.00 mg/dL Final         Passed - ALT in normal range and within 360 days    ALT  Date Value Ref Range Status  11/11/2022 20 0 - 32 IU/L Final         Passed - AST in normal range and within 360 days    AST  Date Value Ref Range Status  11/11/2022 30 0 - 40 IU/L Final         Passed - Valid encounter within last 12 months    Recent Outpatient Visits           4 months ago Other urinary incontinence   [place] [facility] [place], [place] T, NP   6 months ago Medicare annual wellness visit, initial   [place] [facility] [place], [place] T, NP   1 year ago Acute pain of right knee   [place] [facility] Mecum, Oswaldo Conroy, PA-C   2 years ago Diarrhea, unspecified type   [place] [facility] McElwee, Lauren A, NP   2 years ago Swelling of digit of right hand   [place] [facility] McElwee, Jake Church, NP       Future Appointments             In 6 months Cannady, Dorie Rank, NP [place] [facility], [facility]

## 2023-05-14 ENCOUNTER — Ambulatory Visit: Payer: Self-pay | Admitting: *Deleted

## 2023-05-14 NOTE — Telephone Encounter (Signed)
Message from [place] B sent at 05/14/2023 11:26 AM EST  Summary: med ?   Pt called in wants to change the way she takes the med to control her peeing, she wants to kn owif its ok, if she takes 1 in the morning and one in the evening, because that may help, because when she is walking , she leaks some          Call History  Contact Date/Time Type Contact Phone/Fax By  05/14/2023 11:25 AM EST Phone (Incoming) [name], Cassandra Grandmaison (Self) [phone] Rexene Edison) [place], [place]   Reason for Disposition  [1] Caller has URGENT medicine question about med that PCP or specialist prescribed AND [2] triager unable to answer question    Requesting to take the Vesicare 5 mg twice a day to see if it would help with urinary incontinence while walking for exercise.  Answer Assessment - Initial Assessment Questions 1. NAME of MEDICINE: "What medicine(s) are you calling about?"     Vsicare 5 mg 2. QUESTION: "What is your question?" (e.g., double dose of medicine, side effect)     I have neuropathy and cramps real bad.   I've been instructed to walk daily to help with this.   I have to walk.   When I'm walking I'm leaking urine.   Can I take the medicine twice a day?  (Vesicare 5 mg) I take it at night.   I was taking it in the morning but I started taking it at night to see if it would help but I can't tell a difference.   The medicine has helped with the incontinence but I'm still having some leakage when I'm talking. 3. PRESCRIBER: "Who prescribed the medicine?" Reason: if prescribed by specialist, call should be referred to that group.     Aura Dials, NP 4. SYMPTOMS: "Do you have any symptoms?" If Yes, ask: "What symptoms are you having?"  "How bad are the symptoms (e.g., mild, moderate, severe)     Urinary incontinence when doing her daily walk.   She is requesting to take this once in the morning and once at night to see if it would help. 5. PREGNANCY:  "Is there any chance that you are pregnant?"  "When was your last menstrual period?"     N/A due to age  Protocols used: Medication Question Call-A-AH

## 2023-05-14 NOTE — Telephone Encounter (Signed)
  Chief Complaint: Having urinary leakage while she's doing her daily walking for exercise.   Wanting to know if she can take the Vesicare 5 mg twice a day instead of once.   She takes it every night.   She's requesting to take it in the morning and at night to see if it would help.     Symptoms: The medicine has helped with the leakage but when I'm doing my daily walk I'm still having issues with urinary leakage.   Frequency: Happens during her daily walks for neuropathy and cramping in her legs. Pertinent Negatives: Patient denies N/A Disposition: [] ED /[] Urgent Care (no appt availability in office) / [] Appointment(In office/virtual)/ []  [facility]/ [] Home Care/ [] Refused Recommended Disposition /[] [facility]/ [x]  Follow-up with PCP Additional Notes: Message sent to [facility], NP.    Pt. Agreeable to someone calling her back with Jolene's recommendation.

## 2023-05-15 NOTE — Telephone Encounter (Signed)
Called and LVM asking for patient to please return my call.   OK for [facility] to give patient the message from the provider if she call back.

## 2023-05-15 NOTE — Telephone Encounter (Signed)
 Routing to provider to advise.

## 2023-05-18 NOTE — Telephone Encounter (Signed)
Gave message from PCP on 05/18/23. Pt asking if order can be sent in for 5 mg Vesicare twice daily so she doesn't run into any problems with insurance not paying for it.

## 2023-05-19 MED ORDER — SOLIFENACIN SUCCINATE 5 MG PO TABS: 5.0000 mg | ORAL_TABLET | Freq: Two times a day (BID) | ORAL | 4 refills | Status: DC

## 2023-05-19 NOTE — Addendum Note (Signed)
Addended by: Aura Dials T on: 05/19/2023 11:41 AM   Modules accepted: Orders

## 2023-05-28 ENCOUNTER — Ambulatory Visit: Payer: PPO | Admitting: Nurse Practitioner

## 2023-05-28 VITALS — BP 104/67 | HR 60 | Ht 66.0 in | Wt 106.0 lb

## 2023-05-28 DIAGNOSIS — N39498 Other specified urinary incontinence: Secondary | ICD-10-CM

## 2023-05-28 NOTE — Progress Notes (Signed)
BP 104/67 (BP Location: Left Arm, Patient Position: Sitting, Cuff Size: Normal)   Pulse 60   Ht 5\' 6"  (1.676 m)   Wt 106 lb (48.1 kg)   SpO2 98%   BMI 17.11 kg/m    Subjective:    Patient ID: Cassandra [name], female    DOB: 06/24/1940, 83 y.o.   MRN: [id number]  HPI: Cassandra [name] is a 83 y.o. female  Chief Complaint  Patient presents with   bladder control    Leaking problem, frequency urinating is getting worse and sometimes had to use 2 pads a day   BLADDER CONCERNS Patient states she has been having urinary leakage, urinary frequency, incontinence from not getting to the bathroom soon enough.  She does walk enough but she isn't able to walk for more than 50 mins without having to go to the bathroom.  She took the Vesicare 5mg  BID but didn't seem improvement.  She wasn't able to do the pelvic floor physical therapy due to her cramps being exacerbated.  She feels like she isn't emptying her bladder all the way.  Has to bend over while she is on the toilet to get more out.   Relevant past medical, surgical, family and social history reviewed and updated as indicated. Interim medical history since our last visit reviewed. Allergies and medications reviewed and updated.  Review of Systems  Genitourinary:  Positive for difficulty urinating and urgency.       Urinary leakage    Per HPI unless specifically indicated above     Objective:    BP 104/67 (BP Location: Left Arm, Patient Position: Sitting, Cuff Size: Normal)   Pulse 60   Ht 5\' 6"  (1.676 m)   Wt 106 lb (48.1 kg)   SpO2 98%   BMI 17.11 kg/m   Wt Readings from Last 3 Encounters:  05/28/23 106 lb (48.1 kg)  12/31/22 106 lb 12.8 oz (48.4 kg)  11/11/22 108 lb (49 kg)    Physical Exam Vitals and nursing note reviewed.  Constitutional:      General: She is not in acute distress.    Appearance: Normal appearance. She is normal weight. She is not ill-appearing, toxic-appearing or diaphoretic.  HENT:     Head:  Normocephalic.     Right Ear: External ear normal.     Left Ear: External ear normal.     Nose: Nose normal.     Mouth/Throat:     Mouth: Mucous membranes are moist.     Pharynx: Oropharynx is clear.  Eyes:     General:        Right eye: No discharge.        Left eye: No discharge.     Extraocular Movements: Extraocular movements intact.     Conjunctiva/sclera: Conjunctivae normal.     Pupils: Pupils are equal, round, and reactive to light.  Cardiovascular:     Rate and Rhythm: Normal rate and regular rhythm.     Heart sounds: No murmur heard. Pulmonary:     Effort: Pulmonary effort is normal. No respiratory distress.     Breath sounds: Normal breath sounds. No wheezing or rales.  Musculoskeletal:     Cervical back: Normal range of motion and neck supple.  Skin:    General: Skin is warm and dry.     Capillary Refill: Capillary refill takes less than 2 seconds.  Neurological:     General: No focal deficit present.     Mental Status: She  is alert and oriented to person, place, and time. Mental status is at baseline.  Psychiatric:        Mood and Affect: Mood normal.        Behavior: Behavior normal.        Thought Content: Thought content normal.        Judgment: Judgment normal.     Results for orders placed or performed in visit on 12/31/22  Microscopic Examination   Collection Time: 12/31/22  3:18 PM   Urine  Result Value Ref Range   WBC, UA 6-10 (A) 0 - 5 /hpf   RBC, Urine 0-2 0 - 2 /hpf   Epithelial Cells (non renal) 0-10 0 - 10 /hpf   Bacteria, UA None seen None seen/Few  Urinalysis, Routine w reflex microscopic   Collection Time: 12/31/22  3:18 PM  Result Value Ref Range   Specific Gravity, UA 1.025 1.005 - 1.030   pH, UA 6.0 5.0 - 7.5   Color, UA Yellow Yellow   Appearance Ur Cloudy (A) Clear   Leukocytes,UA 1+ (A) Negative   Protein,UA Negative Negative/Trace   Glucose, UA Negative Negative   Ketones, UA Negative Negative   RBC, UA Trace (A) Negative    Bilirubin, UA Negative Negative   Urobilinogen, Ur 0.2 0.2 - 1.0 mg/dL   Nitrite, UA Negative Negative   Microscopic Examination See below:       Assessment & Plan:   Problem List Items Addressed This Visit       Other   Absence of bladder continence - Primary   Chronic.  Trial of Vesicare and didn't see much improvement with symptoms. Patient is on max dose per day. Tried pelvic floor physical therapy but unable to tolerate due cramps. Will continue Vesicare at this time and place referral to Urogyn.       Relevant Orders   Ambulatory referral to Urogynecology     Follow up plan: No follow-ups on file.

## 2023-05-28 NOTE — Assessment & Plan Note (Signed)
Chronic.  Trial of Vesicare and didn't see much improvement with symptoms. Patient is on max dose per day. Tried pelvic floor physical therapy but unable to tolerate due cramps. Will continue Vesicare at this time and place referral to Urogyn.

## 2023-06-02 ENCOUNTER — Telehealth: Payer: Self-pay | Admitting: Nurse Practitioner

## 2023-06-02 NOTE — Telephone Encounter (Signed)
 Brayton Caves do you know where this referral was sent to? It looks like they tried to reach out to the patient but didn't get her.

## 2023-06-02 NOTE — Telephone Encounter (Signed)
 Copied from CRM [id number]. Topic: Referral - Status >> Jun 02, 2023 11:49 AM Haroldine Laws wrote: Reason for CRM: pt called asking about the referral for urogyn.  She said no one has called her.  CB#  [phone]

## 2023-06-04 NOTE — Telephone Encounter (Signed)
 Called and LVM asking for patient to please return my call.   OK for E2C2 to give message with referral information to the patient if she calls back.

## 2023-06-25 DIAGNOSIS — D2262 Melanocytic nevi of left upper limb, including shoulder: Secondary | ICD-10-CM | POA: Diagnosis not present

## 2023-06-25 DIAGNOSIS — D2261 Melanocytic nevi of right upper limb, including shoulder: Secondary | ICD-10-CM | POA: Diagnosis not present

## 2023-06-25 DIAGNOSIS — D2271 Melanocytic nevi of right lower limb, including hip: Secondary | ICD-10-CM | POA: Diagnosis not present

## 2023-06-25 DIAGNOSIS — D485 Neoplasm of uncertain behavior of skin: Secondary | ICD-10-CM | POA: Diagnosis not present

## 2023-06-25 DIAGNOSIS — Z85820 Personal history of malignant melanoma of skin: Secondary | ICD-10-CM | POA: Diagnosis not present

## 2023-06-25 DIAGNOSIS — Z85828 Personal history of other malignant neoplasm of skin: Secondary | ICD-10-CM | POA: Diagnosis not present

## 2023-06-25 DIAGNOSIS — L821 Other seborrheic keratosis: Secondary | ICD-10-CM | POA: Diagnosis not present

## 2023-06-25 DIAGNOSIS — Z08 Encounter for follow-up examination after completed treatment for malignant neoplasm: Secondary | ICD-10-CM | POA: Diagnosis not present

## 2023-06-25 DIAGNOSIS — C44619 Basal cell carcinoma of skin of left upper limb, including shoulder: Secondary | ICD-10-CM | POA: Diagnosis not present

## 2023-06-25 DIAGNOSIS — D2272 Melanocytic nevi of left lower limb, including hip: Secondary | ICD-10-CM | POA: Diagnosis not present

## 2023-06-25 DIAGNOSIS — D225 Melanocytic nevi of trunk: Secondary | ICD-10-CM | POA: Diagnosis not present

## 2023-09-03 ENCOUNTER — Encounter: Payer: Self-pay | Admitting: Obstetrics and Gynecology

## 2023-09-03 ENCOUNTER — Ambulatory Visit: Payer: PPO | Admitting: Obstetrics and Gynecology

## 2023-09-03 VITALS — BP 100/72 | HR 90 | Ht 64.57 in | Wt 101.4 lb

## 2023-09-03 DIAGNOSIS — N3281 Overactive bladder: Secondary | ICD-10-CM | POA: Diagnosis not present

## 2023-09-03 DIAGNOSIS — N952 Postmenopausal atrophic vaginitis: Secondary | ICD-10-CM | POA: Diagnosis not present

## 2023-09-03 DIAGNOSIS — K5901 Slow transit constipation: Secondary | ICD-10-CM

## 2023-09-03 DIAGNOSIS — R32 Unspecified urinary incontinence: Secondary | ICD-10-CM

## 2023-09-03 MED ORDER — MIRABEGRON ER 25 MG PO TB24: 25.0000 mg | ORAL_TABLET | Freq: Every day | ORAL | 5 refills | Status: DC

## 2023-09-03 MED ORDER — ESTRADIOL 0.1 MG/GM VA CREA: 0.5000 g | TOPICAL_CREAM | VAGINAL | 11 refills | Status: AC

## 2023-09-03 NOTE — Patient Instructions (Addendum)
 Stop the vesicare .   You can start Pumpkin seed extract up to 5gm (5000mg ) daily and see if this is helpful for your bladder.   You can start Myrbetriq 25mg  daily if this is not too expensive. Please let me know if this is not affordable.  You can also do the TENS unit on the inside of the ankle. Find the ankle bone and go 3 fingers up and 1 back to get close to the tibial nerve to stimulate this.   Consider doing the estrogen cream x2 weekly inside the vagina to support the tissues and help with the atrophy that is occurring.   For your bowels consider getting a stool that will bring your knees up closer to your belly button and support emptying easier.

## 2023-09-03 NOTE — Progress Notes (Signed)
 [name] New Patient Evaluation and Consultation  Referring Provider: Aileen Alexanders, NP PCP: Lemar Pyles, NP Date of Service: 09/03/2023  SUBJECTIVE Chief Complaint: New Patient (Initial Visit) (Incontinence/Pt was not able urinate patient stated didn't drink anything this morning but has used bathroom 3 times )  History of Present Illness: Cassandra [name] a 83 y.o. White or Caucasian female seen in consultation at the request of Dr. Curt Dover for evaluation of urinary incontinence.    Review of records significant for: Per note with Aileen Alexanders, NP on 05/28/23: Chronic. Trial of Vesicare  and didn't see much improvement with symptoms. Patient [name] on max dose per day. Tried pelvic floor physical therapy but unable to tolerate due cramps. Will continue Vesicare  at this time and place referral to Urogyn   Urinary Symptoms: Leaks urine with exercise and with movement to the bathroom Leaks 2 time(s) per days.  Pad use: 2 pads per day.   Patient [name] bothered by UI symptoms.  Day time voids 6-8.  Nocturia: 1-2 times per night to void. Voiding dysfunction:  does not empty bladder well.  Patient does not use a catheter to empty bladder.  When urinating, patient feels a weak stream and to push on her belly or vagina to empty bladder Drinks: 60oz Water per day  UTIs: 0 UTI's in the last year.   Denies history of blood in urine, kidney or bladder stones, pyelonephritis, bladder cancer, and kidney cancer No results found for the last 90 days.   Pelvic Organ Prolapse Symptoms:                  Patient Denies a feeling of a bulge the vaginal area.   Bowel Symptom: Bowel movements: 2-4 time(s) per week Stool consistency: soft  Straining: yes.  Splinting: yes.  Incomplete evacuation: yes.  Patient Denies accidental bowel leakage / fecal incontinence Bowel regimen: diet Last colonoscopy: Date 2009 [facility] Colonoscopy          Completed or No Longer  Recommended     Colonoscopy  Discontinued      Frequency changed to Never automatically (Topic No Longer Applies)   07/27/2007  [facility] Colonoscopy component of [facility] COLONOSCOPY   Only the first 1 history entries have been loaded, but more history exists.                Sexual Function Sexually active: no.  Sexual orientation: Straight Pain with sex: No  Pelvic Pain Denies pelvic pain    Past Medical History:  Past Medical History:  Diagnosis Date   Cancer (HCC)    skin cancer   Hepatitis    40 years ago   Irritable bowel syndrome    history of....   Nerve damage of left foot      Past Surgical History:   Past Surgical History:  Procedure Laterality Date   ABDOMINAL HYSTERECTOMY     BREAST SURGERY     BIOPSY   BUNIONECTOMY Bilateral 10/25/2020   Procedure: Tillman Folks;  Surgeon: Marlynn Singer, MD;  Location: [facility] ORS;  Service: Orthopedics;  Laterality: Bilateral;   CATARACT EXTRACTION W/PHACO Left 11/13/2015   Procedure: CATARACT EXTRACTION PHACO AND INTRAOCULAR LENS PLACEMENT (IOC);  Surgeon: Clair Crews, MD;  Location: [facility] ORS;  Service: Ophthalmology;  Laterality: Left;  US  00:31 AP% 11.9 CDE 3.72 Fluid pack  lot # [id number]   CATARACT EXTRACTION W/PHACO Right 12/11/2015   Procedure: CATARACT EXTRACTION PHACO AND INTRAOCULAR LENS PLACEMENT (IOC);  Surgeon: Clair Crews,  MD;  Location: [facility] ORS;  Service: Ophthalmology;  Laterality: Right;  Lot# [id number] H US : 00:42.4 AP%: 18.9 CDE:8.01    COLONOSCOPY     SKIN CANCER EXCISION     TONSILLECTOMY       Past OB/GYN History: [id number] Menopausal: Yes Contraception: NA. Last pap smear was unknown.  Any history of abnormal pap smears: no. [facility] PAP   This patient has no relevant Health Maintenance data.     Medications: Patient has a current medication list which includes the following prescription(s): biotin, estradiol, ferrous sulfate, ipratropium, magnesium, mirabegron er, fish oil, cyanocobalamin ,  collagen-vitamin c-biotin, and primidone.   Allergies: Patient has no known allergies.   Social History:  Social History   Tobacco Use   Smoking status: Never   Smokeless tobacco: Never  Vaping Use   Vaping status: Never Used  Substance Use Topics   Alcohol use: No   Drug use: No    Relationship status: married Patient lives with Husband.   Patient [name] employed as a Catering manager. Regular exercise: Yes: Yardwork and walking History of abuse: No  Family History:   Family History  Problem Relation Age of Onset   Kidney Stones Mother    Cancer Father    Cancer Sister        colon and ovarian     Review of Systems: Review of Systems  Constitutional:  Negative for chills and fever.  Respiratory:  Negative for cough and shortness of breath.   Cardiovascular:  Negative for chest pain and palpitations.  Gastrointestinal:  Negative for abdominal pain, blood in stool, constipation and diarrhea.  Skin:  Negative for rash.  Neurological:  Negative for weakness.  Psychiatric/Behavioral:  Negative for depression and suicidal ideas.      OBJECTIVE Physical Exam: Vitals:   09/03/23 0952 09/03/23 1056  BP: (!) 86/63 100/72  Pulse: (!) 49 90  Weight: 101 lb 6.4 oz (46 kg)   Height: 5' 4.57" (1.64 m)     Physical Exam Vitals reviewed. Exam conducted with a chaperone present.  Constitutional:      Appearance: Normal appearance.  Pulmonary:     Effort: Pulmonary effort [name] normal.  Abdominal:     Palpations: Abdomen [name] soft.  Neurological:     General: No focal deficit present.     Mental Status: She [name] alert and oriented to person, place, and time.  Psychiatric:        Mood and Affect: Mood normal.        Behavior: Behavior normal. Behavior [name] cooperative.        Thought Content: Thought content normal.      GU / Detailed Urogynecologic Evaluation:  Pelvic Exam: Normal external female genitalia; Bartholin's and Skene's glands normal in appearance; urethral meatus  normal in appearance, no urethral masses or discharge.   CST: negative     s/p hysterectomy: Speculum exam reveals normal vaginal mucosa with  atrophy and normal vaginal cuff.  Adnexa normal adnexa.     Pelvic floor strength I/V  Pelvic floor musculature: Right levator non-tender, Right obturator non-tender, Left levator non-tender, Left obturator non-tender  POP-Q:   POP-Q  -3                                            Aa   -3  Ba  -6                                              C   2                                            Gh  3.5                                            Pb  6.5                                            tvl   -2.5                                            Ap  -2.5                                            Bp                                                 D      Rectal Exam:  Normal external exam.   Post-Void Residual (PVR) by Bladder Scan: In order to evaluate bladder emptying, we discussed obtaining a postvoid residual and patient agreed to this procedure.  Procedure: The ultrasound unit was placed on the patient's abdomen in the suprapubic region after the patient had voided.    Post Void Residual - 09/03/23 1018       Post Void Residual   Post Void Residual 3 mL              Laboratory Results:   Lab Results  Component Value Date   CREATININE 0.81 11/11/2022   CREATININE 0.80 10/12/2020   CREATININE 0.88 06/27/2020    Lab Results  Component Value Date   HGBA1C 5.6 06/27/2020    Lab Results  Component Value Date   HGB 14.4 11/11/2022     ASSESSMENT AND PLAN Ms. Hinson [name] a 83 y.o. with:  1. OAB (overactive bladder)   2. Incontinence in female   3. Slow transit constipation   4. Vaginal atrophy    We discussed the symptoms of overactive bladder (OAB), which include urinary urgency, urinary frequency, nocturia, with or without urge incontinence.  While we do  not know the exact etiology of OAB, several treatment options exist. We discussed management including behavioral therapy (decreasing bladder irritants, urge suppression strategies, timed voids, bladder retraining), physical therapy, medication; for refractory cases posterior tibial nerve stimulation, sacral neuromodulation, and intravesical botulinum toxin injection. For anticholinergic medications, we discussed the potential side effects of anticholinergics including dry eyes,  dry mouth, constipation, cognitive impairment and urinary retention. For Beta-3 agonist medication, we discussed the potential side effect of elevated blood pressure which [name] more likely to occur in individuals with uncontrolled hypertension. Concern that patient [name] having sensation of incomplete bladder emptying related to anticholinergic medication. Encouraged her to stop the Vesicare  and tomorrow start Myrbetriq 25mg  daily. She has lower blood pressure so her daughter and she are not as concerned about a small increase in blood pressure. We also discussed natural inclusions such as pumpkin seed extract up to 5gm (5000mg ) daily for OAB symptoms and the use of a TENS unit in a similar position as PTNS. Information given to patient's daughter. If patient has good results from the [facility] and feels the need for more support, can increase to 50mg  daily.  For patient's slow transit constipation, she has a very mild rectocele and she reportedly has to sometimes splint or push to assist in bowel movements. We discussed getting a squatty potty to help bring her knees up for better bowel movements.  Patient has vaginal atrophy on exam. She would benefit from estrogen cream. Patient to use a blueberry sized amount into the vagina. She may use this nightly for 2 weeks and then twice weekly after. We discussed using her finger instead of using the applicator.    Patient to follow up in 3 months or sooner if needed. Patient to call if  medication [name] too expensive.     Zaiden Ludlum G Cassandra Pettibone, NP

## 2023-09-22 ENCOUNTER — Ambulatory Visit: Payer: PPO | Admitting: Obstetrics and Gynecology

## 2023-10-05 DIAGNOSIS — G629 Polyneuropathy, unspecified: Secondary | ICD-10-CM | POA: Diagnosis not present

## 2023-10-05 DIAGNOSIS — G250 Essential tremor: Secondary | ICD-10-CM | POA: Diagnosis not present

## 2023-10-05 DIAGNOSIS — Z1331 Encounter for screening for depression: Secondary | ICD-10-CM | POA: Diagnosis not present

## 2023-10-05 DIAGNOSIS — R2689 Other abnormalities of gait and mobility: Secondary | ICD-10-CM | POA: Diagnosis not present

## 2023-11-12 DIAGNOSIS — L905 Scar conditions and fibrosis of skin: Secondary | ICD-10-CM | POA: Diagnosis not present

## 2023-11-12 DIAGNOSIS — C44619 Basal cell carcinoma of skin of left upper limb, including shoulder: Secondary | ICD-10-CM | POA: Diagnosis not present

## 2023-11-13 NOTE — Patient Instructions (Signed)
 Be Involved in Caring For Your Health:  Taking Medications When medications are taken as directed, they can greatly improve your health. But if they are not taken as prescribed, they may not work. In some cases, not taking them correctly can be harmful. To help ensure your treatment remains effective and safe, understand your medications and how to take them. Bring your medications to each visit for review by your provider.  Your lab results, notes, and after visit summary will be available on My Chart. We strongly encourage you to use this feature. If lab results are abnormal the clinic will contact you with the appropriate steps. If the clinic does not contact you assume the results are satisfactory. You can always view your results on My Chart. If you have questions regarding your health or results, please contact the clinic during office hours. You can also ask questions on My Chart.  We at [facility] are grateful that you chose [place] to provide your care. We strive to provide evidence-based and compassionate care and are always looking for feedback. If you get a survey from the clinic please complete this so we can hear your opinions.  Healthy Eating, Adult Healthy eating may help you get and keep a healthy body weight, reduce the risk of chronic disease, and live a long and productive life. It is important to follow a healthy eating pattern. Your nutritional and calorie needs should be met mainly by different nutrient-rich foods. What are tips for following this plan? Reading food labels Read labels and choose the following: Reduced or low sodium products. Juices with 100% fruit juice. Foods with low saturated fats (<3 g per serving) and high polyunsaturated and monounsaturated fats. Foods with whole grains, such as whole wheat, cracked wheat, brown rice, and wild rice. Whole grains that are fortified with folic acid. This is recommended for females who are pregnant or who want  to become pregnant. Read labels and do not eat or drink the following: Foods or drinks with added sugars. These include foods that contain brown sugar, corn sweetener, corn syrup, dextrose, fructose, glucose, high-fructose corn syrup, honey, invert sugar, lactose, malt syrup, maltose, molasses, raw sugar, sucrose, trehalose, or turbinado sugar. Limit your intake of added sugars to less than 10% of your total daily calories. Do not eat more than the following amounts of added sugar per day: 6 teaspoons (25 g) for females. 9 teaspoons (38 g) for males. Foods that contain processed or refined starches and grains. Refined grain products, such as white flour, degermed cornmeal, white bread, and white rice. Shopping Choose nutrient-rich snacks, such as vegetables, whole fruits, and nuts. Avoid high-calorie and high-sugar snacks, such as potato chips, fruit snacks, and candy. Use oil-based dressings and spreads on foods instead of solid fats such as butter, margarine, sour cream, or cream cheese. Limit pre-made sauces, mixes, and "instant" products such as flavored rice, instant noodles, and ready-made pasta. Try more plant-protein sources, such as tofu, tempeh, black beans, edamame, lentils, nuts, and seeds. Explore eating plans such as the Mediterranean diet or vegetarian diet. Try heart-healthy dips made with beans and healthy fats like hummus and guacamole. Vegetables go great with these. Cooking Use oil to saut or stir-fry foods instead of solid fats such as butter, margarine, or lard. Try baking, boiling, grilling, or broiling instead of frying. Remove the fatty part of meats before cooking. Steam vegetables in water or broth. Meal planning  At meals, imagine dividing your plate into fourths: One-half of  your plate is fruits and vegetables. One-fourth of your plate is whole grains. One-fourth of your plate is protein, especially lean meats, poultry, eggs, tofu, beans, or nuts. Include  low-fat dairy as part of your daily diet. Lifestyle Choose healthy options in all settings, including home, work, school, restaurants, or stores. Prepare your food safely: Wash your hands after handling raw meats. Where you prepare food, keep surfaces clean by regularly washing with hot, soapy water. Keep raw meats separate from ready-to-eat foods, such as fruits and vegetables. Cook seafood, meat, poultry, and eggs to the recommended temperature. Get a food thermometer. Store foods at safe temperatures. In general: Keep cold foods at 76F (4.4C) or below. Keep hot foods at 176F (60C) or above. Keep your freezer at [facility] (-17.8C) or below. Foods are not safe to eat if they have been between the temperatures of 40-176F (4.4-60C) for more than 2 hours. What foods should I eat? Fruits Aim to eat 1-2 cups of fresh, canned (in natural juice), or frozen fruits each day. One cup of fruit equals 1 small apple, 1 large banana, 8 large strawberries, 1 cup (237 g) canned fruit,  cup (82 g) dried fruit, or 1 cup (240 mL) 100% juice. Vegetables Aim to eat 2-4 cups of fresh and frozen vegetables each day, including different varieties and colors. One cup of vegetables equals 1 cup (91 g) broccoli or cauliflower florets, 2 medium carrots, 2 cups (150 g) raw, leafy greens, 1 large tomato, 1 large bell pepper, 1 large sweet potato, or 1 medium white potato. Grains Aim to eat 5-10 ounce-equivalents of whole grains each day. Examples of 1 ounce-equivalent of grains include 1 slice of bread, 1 cup (40 g) ready-to-eat cereal, 3 cups (24 g) popcorn, or  cup (93 g) cooked rice. Meats and other proteins Try to eat 5-7 ounce-equivalents of protein each day. Examples of 1 ounce-equivalent of protein include 1 egg,  oz nuts (12 almonds, 24 pistachios, or 7 walnut halves), 1/4 cup (90 g) cooked beans, 6 tablespoons (90 g) hummus or 1 tablespoon (16 g) peanut butter. A cut of meat or fish that is the size of a deck  of cards is about 3-4 ounce-equivalents (85 g). Of the protein you eat each week, try to have at least 8 sounce (227 g) of seafood. This is about 2 servings per week. This includes salmon, trout, herring, sardines, and anchovies. Dairy Aim to eat 3 cup-equivalents of fat-free or low-fat dairy each day. Examples of 1 cup-equivalent of dairy include 1 cup (240 mL) milk, 8 ounces (250 g) yogurt, 1 ounces (44 g) natural cheese, or 1 cup (240 mL) fortified soy milk. Fats and oils Aim for about 5 teaspoons (21 g) of fats and oils per day. Choose monounsaturated fats, such as canola and olive oils, mayonnaise made with olive oil or avocado oil, avocados, peanut butter, and most nuts, or polyunsaturated fats, such as sunflower, corn, and soybean oils, walnuts, pine nuts, sesame seeds, sunflower seeds, and flaxseed. Beverages Aim for 6 eight-ounce glasses of water per day. Limit coffee to 3-5 eight-ounce cups per day. Limit caffeinated beverages that have added calories, such as soda and energy drinks. If you drink alcohol: Limit how much you have to: 0-1 drink a day if you are female. 0-2 drinks a day if you are female. Know how much alcohol is in your drink. In the [place] one drink is one 12 oz bottle of beer (355 mL), one 5 oz glass of wine (  148 mL), or one 1 oz glass of hard liquor (44 mL). Seasoning and other foods Try not to add too much salt to your food. Try using herbs and spices instead of salt. Try not to add sugar to food. This information is based on [place] nutrition guidelines. To learn more, visit [employer].be. Exact amounts may vary. You may need different amounts. This information is not intended to replace advice given to you by your health care provider. Make sure you discuss any questions you have with your health care provider. Document Revised: 12/30/2021 Document Reviewed: 12/30/2021 Elsevier Patient Education  2024 [employer].

## 2023-11-16 ENCOUNTER — Ambulatory Visit (INDEPENDENT_AMBULATORY_CARE_PROVIDER_SITE_OTHER): Payer: Self-pay | Admitting: Nurse Practitioner

## 2023-11-16 ENCOUNTER — Encounter: Payer: Self-pay | Admitting: Nurse Practitioner

## 2023-11-16 VITALS — BP 86/53 | HR 60 | Temp 98.0°F | Ht 64.0 in | Wt 104.0 lb

## 2023-11-16 DIAGNOSIS — Z9189 Other specified personal risk factors, not elsewhere classified: Secondary | ICD-10-CM | POA: Diagnosis not present

## 2023-11-16 DIAGNOSIS — Z0000 Encounter for general adult medical examination without abnormal findings: Secondary | ICD-10-CM | POA: Diagnosis not present

## 2023-11-16 DIAGNOSIS — N3946 Mixed incontinence: Secondary | ICD-10-CM

## 2023-11-16 DIAGNOSIS — G2581 Restless legs syndrome: Secondary | ICD-10-CM

## 2023-11-16 DIAGNOSIS — E7800 Pure hypercholesterolemia, unspecified: Secondary | ICD-10-CM | POA: Diagnosis not present

## 2023-11-16 DIAGNOSIS — G250 Essential tremor: Secondary | ICD-10-CM | POA: Diagnosis not present

## 2023-11-16 DIAGNOSIS — G629 Polyneuropathy, unspecified: Secondary | ICD-10-CM

## 2023-11-16 NOTE — Assessment & Plan Note (Signed)
 Chronic, ongoing, followed by neurology (Dr. Maree).  Continue this collaboration and current medication regimen as prescribed by him.  Obtain labs today: CBC, CMP, TSH, Mag, Iron, Ferritin.  Advised her to discuss with neurology if ongoing increased fatigue, which has been present since recent medication changes with neurology and UroGyn.

## 2023-11-16 NOTE — Assessment & Plan Note (Signed)
Chronic, ongoing.  Followed by neurology.  Continue Primidone at this time, as prescribed by them.  Check labs today.  Adjust plan of care as needed.  Continue collaboration with neurology, recent notes reviewed.

## 2023-11-16 NOTE — Assessment & Plan Note (Signed)
 Chronic, ongoing, followed by neurology.  Continue current medication regimen as prescribed by them, recent notes reviewed.  Advised her to discuss with neurology if ongoing increased fatigue, which has been present since recent medication changes with neurology and UroGyn.

## 2023-11-16 NOTE — Progress Notes (Signed)
 BP (!) 86/53   Pulse 60   Temp 98 F (36.7 C) (Oral)   Ht 5' 4 (1.626 m)   Wt 104 lb (47.2 kg)   SpO2 98%   BMI 17.85 kg/m    Subjective:    Patient ID: Cassandra [name], female    DOB: 10-17-1940, 82 y.o.   MRN: [id number]  HPI: Cassandra [name] is a 83 y.o. female presenting on 11/16/2023 for annual physical exam. Current medical complaints include:recent fall on Friday  She currently lives with: husband Menopausal Symptoms: no  Continues Mybetriq for OAB, saw them 09/03/23 and they increased Myrbetriq .  Has been tired and had dry mouth with this.  Continues to use Estrace .  NEUROPATHY Last saw neurology on 10/05/23, they increased her Primidone to 125 MG BID and she continues supplements with this. Last EMG was 09/09/18. Cramping continues at baseline. She is eating a lot of mustard. Neuropathy status: stable  Satisfied with current treatment?: yes Medication side effects: tired at times with increase Medication compliance:  good compliance Location: bilateral lower legs - L>R Pain: yes Severity: varies Quality: cramping sensation Frequency: constant Bilateral: yes Symmetric: yes Numbness: yes Decreased sensation: yes Weakness: yes Context: stable Alleviating factors: nothing Aggravating factors: unknown Treatments attempted: Requip , Primidone, Magnesium  The ASCVD Risk score (Arnett DK, et al., 2019) failed to calculate for the following reasons:   The 2019 ASCVD risk score is only valid for ages 4 to 48  Depression Screen done today and results listed below:     11/16/2023    8:14 AM 12/31/2022    3:18 PM 11/11/2022    1:18 PM 07/22/2021    1:44 PM 10/12/2020   10:53 AM  Depression screen PHQ 2/9  Decreased Interest 0 0 0 0 0  Down, Depressed, Hopeless 0 0 0 0 0  PHQ - 2 Score 0 0 0 0 0  Altered sleeping 0 0 0 0   Tired, decreased energy 2 1 0 0   Change in appetite 0 0 0 0   Feeling bad or failure about yourself  0 0 0 0   Trouble concentrating 0 0 0 0    Moving slowly or fidgety/restless 0 0 0 0   Suicidal thoughts 0 0 0 0   PHQ-9 Score 2 1 0 0   Difficult doing work/chores Not difficult at all Not difficult at all Not difficult at all Not difficult at all       11/16/2023    8:14 AM 12/31/2022    3:18 PM 11/11/2022    1:17 PM 07/22/2021    1:44 PM  GAD 7 : Generalized Anxiety Score  Nervous, Anxious, on Edge 0 0 0 0  Control/stop worrying 0 0 0 0  Worry too much - different things 0 0 0 0  Trouble relaxing 0 0 0 0  Restless 0 0 0 0  Easily annoyed or irritable 0 0 0 0  Afraid - awful might happen 0 0 0 0  Total GAD 7 Score 0 0 0 0  Anxiety Difficulty Not difficult at all Not difficult at all Not difficult at all Not difficult at all      07/22/2021    1:44 PM 11/11/2022    1:10 PM 11/11/2022    1:23 PM 12/31/2022    3:18 PM 11/16/2023    8:14 AM  Fall Risk  Falls in the past year? 0 1 1 1 1   Was there an injury with Fall?  0 1 1 0 0  Fall Risk Category Calculator 0 3 2 2 1   Fall Risk Category (Retired) Low       (RETIRED) Patient Fall Risk Level Low fall risk       Patient at Risk for Falls Due to No Fall Risks History of fall(s) Impaired balance/gait History of fall(s);Impaired balance/gait No Fall Risks  Fall risk Follow up Falls evaluation completed  Falls evaluation completed Falls evaluation completed;Falls prevention discussed Falls evaluation completed Falls evaluation completed     Data saved with a previous flowsheet row definition    Functional Status Survey: Is the patient deaf or have difficulty hearing?: No Does the patient have difficulty seeing, even when wearing glasses/contacts?: No Does the patient have difficulty concentrating, remembering, or making decisions?: No Does the patient have difficulty walking or climbing stairs?: No Does the patient have difficulty dressing or bathing?: No Does the patient have difficulty doing errands alone such as visiting a doctor's office or shopping?: No   Past Medical  History:  Past Medical History:  Diagnosis Date   Cancer (HCC)    skin cancer   Hepatitis    40 years ago   Irritable bowel syndrome    history of....   Nerve damage of left foot     Surgical History:  Past Surgical History:  Procedure Laterality Date   ABDOMINAL HYSTERECTOMY     BREAST SURGERY     BIOPSY   BUNIONECTOMY Bilateral 10/25/2020   Procedure: ROMAYNE;  Surgeon: Cleotilde Barrio, MD;  Location: [facility];  Service: Orthopedics;  Laterality: Bilateral;   CATARACT EXTRACTION W/PHACO Left 11/13/2015   Procedure: CATARACT EXTRACTION PHACO AND INTRAOCULAR LENS PLACEMENT (IOC);  Surgeon: Elsie Carmine, MD;  Location: [facility];  Service: Ophthalmology;  Laterality: Left;  US  00:31 AP% 11.9 CDE 3.72 Fluid pack  lot # [id number]   CATARACT EXTRACTION W/PHACO Right 12/11/2015   Procedure: CATARACT EXTRACTION PHACO AND INTRAOCULAR LENS PLACEMENT (IOC);  Surgeon: Elsie Carmine, MD;  Location: [facility];  Service: Ophthalmology;  Laterality: Right;  Lot# [id number] US : 00:42.4 AP%: 18.9 CDE:8.01    COLONOSCOPY     SKIN CANCER EXCISION     TONSILLECTOMY      Medications:  Current Outpatient Medications on File Prior to Visit  Medication Sig   ALPHA LIPOIC ACID PO Take 600 mg by mouth 2 (two) times daily.   cyanocobalamin  (VITAMIN B12) 1000 MCG tablet Take 3,000 mcg by mouth daily.   estradiol  (ESTRACE ) 0.1 MG/GM vaginal cream Place 0.5 g vaginally 2 (two) times a week. Place 0.5g nightly for two weeks then twice a week after   ferrous sulfate 325 (65 FE) MG tablet Take 325 mg by mouth every other day.   MAGNESIUM GLYCINATE PO Take 200 mg by mouth daily at 2 PM.   mirabegron  ER (MYRBETRIQ ) 25 MG TB24 tablet Take 1 tablet (25 mg total) by mouth daily.   Omega-3 Fatty Acids (FISH OIL) 1000 MG CAPS Take 2,000 mg by mouth daily at 2 PM.   Primidone 125 MG TABS Take 125 mg by mouth 2 (two) times daily.   PUMPKIN SEED PO Take 4,000 mg by mouth 2 (two) times daily.   Specialty  Vitamins Products (BIOTIN PLUS KERATIN PO) Take 25,000 mg by mouth 2 (two) times daily with a meal.   No current facility-administered medications on file prior to visit.    Allergies:  No Known Allergies  Social History:  Social History   Socioeconomic History  Marital status: Married    Spouse name: Not on file   Number of children: Not on file   Years of education: Not on file   Highest education level: 12th grade  Occupational History   Not on file  Tobacco Use   Smoking status: Never   Smokeless tobacco: Never  Vaping Use   Vaping status: Never Used  Substance and Sexual Activity   Alcohol use: No   Drug use: No   Sexual activity: Not Currently  Other Topics Concern   Not on file  Social History Narrative   Not on file   Social Drivers of Health   Financial Resource Strain: Low Risk  (05/28/2023)   Overall Financial Resource Strain (CARDIA)    Difficulty of Paying Living Expenses: Not very hard  Food Insecurity: No Food Insecurity (05/28/2023)   Hunger Vital Sign    Worried About Running Out of Food in the Last Year: Never true    Ran Out of Food in the Last Year: Never true  Transportation Needs: No Transportation Needs (05/28/2023)   PRAPARE - Administrator, Civil Service (Medical): No    Lack of Transportation (Non-Medical): No  Physical Activity: Sufficiently Active (05/28/2023)   Exercise Vital Sign    Days of Exercise per Week: 7 days    Minutes of Exercise per Session: 120 min  Stress: Stress Concern Present (05/28/2023)   [employer] of Occupational Health - Occupational Stress Questionnaire    Feeling of Stress : To some extent  Social Connections: Socially Integrated (05/28/2023)   Social Connection and Isolation Panel    Frequency of Communication with Friends and Family: More than three times a week    Frequency of Social Gatherings with Friends and Family: Twice a week    Attends Religious Services: More than 4 times per year     Active Member of [employer] or Organizations: Yes    Attends Engineer, structural: More than 4 times per year    Marital Status: Married  Catering manager Violence: Not At Risk (11/11/2022)   Humiliation, Afraid, Rape, and Kick questionnaire    Fear of Current or Ex-Partner: No    Emotionally Abused: No    Physically Abused: No    Sexually Abused: No   Social History   Tobacco Use  Smoking Status Never  Smokeless Tobacco Never   Social History   Substance and Sexual Activity  Alcohol Use No    Family History:  Family History  Problem Relation Age of Onset   Kidney Stones Mother    Cancer Father    Cancer Sister        colon and ovarian    Past medical history, surgical history, medications, allergies, family history and social history reviewed with patient today and changes made to appropriate areas of the chart.   ROS All other ROS negative except what is listed above and in the HPI.      Objective:    BP (!) 86/53   Pulse 60   Temp 98 F (36.7 C) (Oral)   Ht 5' 4 (1.626 m)   Wt 104 lb (47.2 kg)   SpO2 98%   BMI 17.85 kg/m   Wt Readings from Last 3 Encounters:  11/16/23 104 lb (47.2 kg)  09/03/23 101 lb 6.4 oz (46 kg)  05/28/23 106 lb (48.1 kg)    Physical Exam Vitals and nursing note reviewed. Exam conducted with a chaperone present.  Constitutional:  General: She is awake. She is not in acute distress.    Appearance: She is well-developed and well-groomed. She is not ill-appearing or toxic-appearing.  HENT:     Head: Normocephalic and atraumatic.     Right Ear: Hearing, tympanic membrane, ear canal and external ear normal. No drainage.     Left Ear: Hearing, tympanic membrane, ear canal and external ear normal. No drainage.     Nose: Nose normal.     Right Sinus: No maxillary sinus tenderness or frontal sinus tenderness.     Left Sinus: No maxillary sinus tenderness or frontal sinus tenderness.     Mouth/Throat:     Mouth: Mucous membranes  are moist.     Pharynx: Oropharynx is clear. Uvula midline. No pharyngeal swelling, oropharyngeal exudate or posterior oropharyngeal erythema.  Eyes:     General: Lids are normal.        Right eye: No discharge.        Left eye: No discharge.     Extraocular Movements: Extraocular movements intact.     Conjunctiva/sclera: Conjunctivae normal.     Pupils: Pupils are equal, round, and reactive to light.     Visual Fields: Right eye visual fields normal and left eye visual fields normal.  Neck:     Thyroid : No thyromegaly.     Vascular: No carotid bruit.     Trachea: Trachea normal.  Cardiovascular:     Rate and Rhythm: Normal rate and regular rhythm.     Heart sounds: Normal heart sounds. No murmur heard.    No gallop.  Pulmonary:     Effort: Pulmonary effort is normal. No accessory muscle usage or respiratory distress.     Breath sounds: Normal breath sounds. No decreased breath sounds, wheezing or rhonchi.     Comments: Some tenderness on palpation of mid left rib area.  No bruising noted or edema. Abdominal:     General: Bowel sounds are normal.     Palpations: Abdomen is soft. There is no hepatomegaly or splenomegaly.     Tenderness: There is no abdominal tenderness.  Musculoskeletal:        General: Normal range of motion.     Cervical back: Normal range of motion and neck supple.     Right lower leg: No edema.     Left lower leg: No edema.  Lymphadenopathy:     Head:     Right side of head: No submental, submandibular, tonsillar, preauricular or posterior auricular adenopathy.     Left side of head: No submental, submandibular, tonsillar, preauricular or posterior auricular adenopathy.     Cervical: No cervical adenopathy.  Skin:    General: Skin is warm and dry.     Capillary Refill: Capillary refill takes less than 2 seconds.     Findings: No rash.  Neurological:     Mental Status: She is alert and oriented to person, place, and time.     Gait: Gait is intact.      Deep Tendon Reflexes: Reflexes are normal and symmetric.     Reflex Scores:      Brachioradialis reflexes are 2+ on the right side and 2+ on the left side.      Patellar reflexes are 2+ on the right side and 2+ on the left side. Psychiatric:        Attention and Perception: Attention normal.        Mood and Affect: Mood normal.        Speech:  Speech normal.        Behavior: Behavior normal. Behavior is cooperative.        Thought Content: Thought content normal.        Judgment: Judgment normal.       11/11/2022    1:26 PM  6CIT Screen  What Year? 0 points  What month? 0 points  What time? 0 points  Count back from 20 0 points  Months in reverse 0 points  Repeat phrase 0 points  Total Score 0 points   Results for orders placed or performed in visit on 12/31/22  Microscopic Examination   Collection Time: 12/31/22  3:18 PM   Urine  Result Value Ref Range   WBC, UA 6-10 (A) 0 - 5 /hpf   RBC, Urine 0-2 0 - 2 /hpf   Epithelial Cells (non renal) 0-10 0 - 10 /hpf   Bacteria, UA None seen None seen/Few  Urinalysis, Routine w reflex microscopic   Collection Time: 12/31/22  3:18 PM  Result Value Ref Range   Specific Gravity, UA 1.025 1.005 - 1.030   pH, UA 6.0 5.0 - 7.5   Color, UA Yellow Yellow   Appearance Ur Cloudy (A) Clear   Leukocytes,UA 1+ (A) Negative   Protein,UA Negative Negative/Trace   Glucose, UA Negative Negative   Ketones, UA Negative Negative   RBC, UA Trace (A) Negative   Bilirubin, UA Negative Negative   Urobilinogen, Ur 0.2 0.2 - 1.0 mg/dL   Nitrite, UA Negative Negative   Microscopic Examination See below:       Assessment & Plan:   Problem List Items Addressed This Visit       Nervous and Auditory   Sensorimotor neuropathy - Primary   Chronic, ongoing, followed by neurology (Dr. Maree).  Continue this collaboration and current medication regimen as prescribed by him.  Obtain labs today: CBC, CMP, TSH, Mag, Iron, Ferritin.  Advised her to discuss  with neurology if ongoing increased fatigue, which has been present since recent medication changes with neurology and UroGyn.      Relevant Medications   Primidone 125 MG TABS   Other Relevant Orders   CBC with Differential/Platelet   Vitamin B12   Ferritin   Iron   Benign essential tremor   Chronic, ongoing, followed by neurology.  Continue current medication regimen as prescribed by them, recent notes reviewed.  Advised her to discuss with neurology if ongoing increased fatigue, which has been present since recent medication changes with neurology and UroGyn.        Other   Restless leg syndrome   Chronic, ongoing.  Followed by neurology.  Continue Primidone at this time, as prescribed by them.  Check labs today.  Adjust plan of care as needed.  Continue collaboration with neurology, recent notes reviewed.      Relevant Orders   CBC with Differential/Platelet   TSH   Vitamin B12   Ferritin   Iron   Magnesium   Elevated low density lipoprotein (LDL) cholesterol level   Noted on past labs, check lipid panel today and continue focus on healthy diet and regular activity.  Due to age and overall healthy lifestyle will avoid starting statin at this time, discussed with patient and she prefers not to start this medication.      Relevant Orders   Comprehensive metabolic panel with GFR   Lipid Panel w/o Chol/HDL Ratio   At risk for loss of bone density   Has not obtained DEXA,  prefers not to at this time and refuses mammogram or any vaccinations.      Absence of bladder continence   Ongoing, followed by UroGyn.  Recent notes reviewed.  Advised her to discuss withurology if ongoing increased fatigue, which has been present since recent medication changes with neurology and UroGyn.      Other Visit Diagnoses       Encounter for annual physical exam       Annual physical today with labs and health maintenance reviewed, discussed with patient.        Follow up plan: Return  in about 1 year (around 11/15/2024) for Annual Physical.   LABORATORY TESTING:  - Pap smear: not applicable  IMMUNIZATIONS:   - Tdap: Tetanus vaccination status reviewed: last tetanus booster within 10 years. - Influenza: Refused - Pneumovax: Refused - Prevnar: Refused - COVID: Refused - HPV: Not applicable - Shingrix vaccine: Refused  SCREENING: -Mammogram: Refused  - Colonoscopy: Not applicable  - Bone Density: Refused -Hearing Test: Not applicable  -Spirometry: Not applicable   PATIENT COUNSELING:   Advised to take 1 mg of folate supplement per day if capable of pregnancy.   Sexuality: Discussed sexually transmitted diseases, partner selection, use of condoms, avoidance of unintended pregnancy  and contraceptive alternatives.   Advised to avoid cigarette smoking.  I discussed with the patient that most people either abstain from alcohol or drink within safe limits (<=14/week and <=4 drinks/occasion for males, <=7/weeks and <= 3 drinks/occasion for females) and that the risk for alcohol disorders and other health effects rises proportionally with the number of drinks per week and how often a drinker exceeds daily limits.  Discussed cessation/primary prevention of drug use and availability of treatment for abuse.   Diet: Encouraged to adjust caloric intake to maintain  or achieve ideal body weight, to reduce intake of dietary saturated fat and total fat, to limit sodium intake by avoiding high sodium foods and not adding table salt, and to maintain adequate dietary potassium and calcium preferably from fresh fruits, vegetables, and low-fat dairy products.    Stressed the importance of regular exercise  Injury prevention: Discussed safety belts, safety helmets, smoke detector, smoking near bedding or upholstery.   Dental health: Discussed importance of regular tooth brushing, flossing, and dental visits.    NEXT PREVENTATIVE PHYSICAL DUE IN 1 YEAR. Return in about 1 year  (around 11/15/2024) for Annual Physical.

## 2023-11-16 NOTE — Assessment & Plan Note (Signed)
 Ongoing, followed by UroGyn.  Recent notes reviewed.  Advised her to discuss withurology if ongoing increased fatigue, which has been present since recent medication changes with neurology and UroGyn.

## 2023-11-16 NOTE — Assessment & Plan Note (Signed)
 Noted on past labs, check lipid panel today and continue focus on healthy diet and regular activity.  Due to age and overall healthy lifestyle will avoid starting statin at this time, discussed with patient and she prefers not to start this medication.

## 2023-11-16 NOTE — Assessment & Plan Note (Signed)
 Has not obtained DEXA, prefers not to at this time and refuses mammogram or any vaccinations.

## 2023-11-17 ENCOUNTER — Ambulatory Visit: Payer: Self-pay | Admitting: Nurse Practitioner

## 2023-11-17 LAB — LIPID PANEL W/O CHOL/HDL RATIO

## 2023-11-17 NOTE — Progress Notes (Signed)
 Contacted via MyChart  Good morning Cassandra [name], your labs have returned with exception of lipid panel which is pending.  If abnormal I will alert you. - CBC overall stable with no anemia. - Kidney function, creatinine and eGFR, remains normal, as is liver function, AST and ALT.  - B12 level is a bit above goal, however continue your supplement as recommended by neurology. Ferritin level mildly elevated this check, we will continue to monitor this. - Remainder of labs overall stable.  Any questions? Keep being amazing!!  Thank you for allowing me to participate in your care.  I appreciate you. Kindest regards, Brentlee Delage

## 2023-11-18 LAB — TSH: TSH: 1.96 u[IU]/mL (ref 0.450–4.500)

## 2023-11-18 LAB — COMPREHENSIVE METABOLIC PANEL WITH GFR
ALT: 20 IU/L (ref 0–32)
AST: 26 IU/L (ref 0–40)
Albumin: 4.6 g/dL (ref 3.7–4.7)
Alkaline Phosphatase: 79 IU/L (ref 44–121)
BUN/Creatinine Ratio: 16 (ref 12–28)
BUN: 11 mg/dL (ref 8–27)
Bilirubin Total: 0.3 mg/dL (ref 0.0–1.2)
CO2: 24 mmol/L (ref 20–29)
Calcium: 9.2 mg/dL (ref 8.7–10.3)
Chloride: 105 mmol/L (ref 96–106)
Creatinine, Ser: 0.69 mg/dL (ref 0.57–1.00)
Globulin, Total: 1.6 g/dL (ref 1.5–4.5)
Glucose: 62 mg/dL — ABNORMAL LOW (ref 70–99)
Potassium: 4.3 mmol/L (ref 3.5–5.2)
Sodium: 143 mmol/L (ref 134–144)
Total Protein: 6.2 g/dL (ref 6.0–8.5)
eGFR: 87 mL/min/1.73 (ref >59)

## 2023-11-18 LAB — VITAMIN B12: Vitamin B-12: 1728 pg/mL — ABNORMAL HIGH (ref 232–1245)

## 2023-11-18 LAB — CBC WITH DIFFERENTIAL/PLATELET
Basophils Absolute: 0 x10E3/uL (ref 0.0–0.2)
Basos: 1 %
EOS (ABSOLUTE): 0.1 x10E3/uL (ref 0.0–0.4)
Eos: 2 %
Hematocrit: 47 % — ABNORMAL HIGH (ref 34.0–46.6)
Hemoglobin: 15 g/dL (ref 11.1–15.9)
Immature Grans (Abs): 0 x10E3/uL (ref 0.0–0.1)
Immature Granulocytes: 0 %
Lymphocytes Absolute: 1.3 x10E3/uL (ref 0.7–3.1)
Lymphs: 37 %
MCH: 29.8 pg (ref 26.6–33.0)
MCHC: 31.9 g/dL (ref 31.5–35.7)
MCV: 93 fL (ref 79–97)
Monocytes Absolute: 0.2 x10E3/uL (ref 0.1–0.9)
Monocytes: 6 %
Neutrophils Absolute: 1.9 x10E3/uL (ref 1.4–7.0)
Neutrophils: 54 %
Platelets: 273 x10E3/uL (ref 150–450)
RBC: 5.03 x10E6/uL (ref 3.77–5.28)
RDW: 14.6 % (ref 11.7–15.4)
WBC: 3.6 x10E3/uL (ref 3.4–10.8)

## 2023-11-18 LAB — LIPID PANEL W/O CHOL/HDL RATIO
Cholesterol, Total: 225 mg/dL — ABNORMAL HIGH (ref 100–199)
HDL: 78 mg/dL (ref >39)
LDL Chol Calc (NIH): 127 mg/dL — AB (ref 0–99)
Triglycerides: 117 mg/dL (ref 0–149)
VLDL Cholesterol Cal: 20 mg/dL (ref 5–40)

## 2023-11-18 LAB — FERRITIN: Ferritin: 181 ng/mL — ABNORMAL HIGH (ref 15–150)

## 2023-11-18 LAB — MAGNESIUM: Magnesium: 2.2 mg/dL (ref 1.6–2.3)

## 2023-11-18 LAB — IRON: Iron: 98 ug/dL (ref 27–139)

## 2023-12-10 ENCOUNTER — Ambulatory Visit: Admitting: Obstetrics and Gynecology

## 2024-01-05 ENCOUNTER — Ambulatory Visit

## 2024-02-10 DIAGNOSIS — G629 Polyneuropathy, unspecified: Secondary | ICD-10-CM | POA: Diagnosis not present

## 2024-02-10 DIAGNOSIS — G250 Essential tremor: Secondary | ICD-10-CM | POA: Diagnosis not present

## 2024-02-11 ENCOUNTER — Other Ambulatory Visit: Payer: Self-pay | Admitting: Obstetrics and Gynecology

## 2024-02-11 DIAGNOSIS — N3281 Overactive bladder: Secondary | ICD-10-CM

## 2024-02-11 DIAGNOSIS — R32 Unspecified urinary incontinence: Secondary | ICD-10-CM

## 2024-02-14 ENCOUNTER — Encounter: Payer: Self-pay | Admitting: Obstetrics and Gynecology

## 2024-02-15 ENCOUNTER — Other Ambulatory Visit: Payer: Self-pay | Admitting: Obstetrics and Gynecology

## 2024-02-15 DIAGNOSIS — R32 Unspecified urinary incontinence: Secondary | ICD-10-CM

## 2024-02-15 DIAGNOSIS — N3281 Overactive bladder: Secondary | ICD-10-CM

## 2024-02-15 MED ORDER — MIRABEGRON ER 25 MG PO TB24: 25.0000 mg | ORAL_TABLET | Freq: Every day | ORAL | 3 refills | Status: AC

## 2024-02-15 MED ORDER — MIRABEGRON ER 25 MG PO TB24: 25.0000 mg | ORAL_TABLET | Freq: Every day | ORAL | 3 refills | Status: DC

## 2024-02-15 NOTE — Addendum Note (Signed)
 Addended by: Lauren Modisette G on: 02/15/2024 12:13 PM   Modules accepted: Orders

## 2024-03-03 DIAGNOSIS — H02889 Meibomian gland dysfunction of unspecified eye, unspecified eyelid: Secondary | ICD-10-CM | POA: Diagnosis not present

## 2024-03-03 DIAGNOSIS — H43813 Vitreous degeneration, bilateral: Secondary | ICD-10-CM | POA: Diagnosis not present

## 2024-03-03 DIAGNOSIS — H35372 Puckering of macula, left eye: Secondary | ICD-10-CM | POA: Diagnosis not present

## 2024-11-17 ENCOUNTER — Encounter: Admitting: Nurse Practitioner
